# Patient Record
Sex: Female | Born: 1985 | Race: White | Hispanic: No | Marital: Married | Smoking: Current every day smoker
Health system: Southern US, Community
[De-identification: ages and names within clinical notes are randomized; demographics above are authoritative.]

---

## 2007-09-06 ENCOUNTER — Encounter: Payer: Self-pay | Admitting: Family Medicine

## 2007-10-04 ENCOUNTER — Encounter: Payer: Self-pay | Admitting: Family Medicine

## 2008-05-29 ENCOUNTER — Encounter: Payer: Self-pay | Admitting: Family Medicine

## 2009-02-24 ENCOUNTER — Ambulatory Visit: Payer: Self-pay | Admitting: Family Medicine

## 2009-03-24 ENCOUNTER — Other Ambulatory Visit: Admission: RE | Admit: 2009-03-24 | Discharge: 2009-03-24 | Payer: Self-pay | Admitting: Family Medicine

## 2009-03-24 ENCOUNTER — Ambulatory Visit: Payer: Self-pay | Admitting: Family Medicine

## 2009-03-26 ENCOUNTER — Encounter: Payer: Self-pay | Admitting: Family Medicine

## 2009-03-26 DIAGNOSIS — F17200 Nicotine dependence, unspecified, uncomplicated: Secondary | ICD-10-CM

## 2009-03-26 DIAGNOSIS — F341 Dysthymic disorder: Secondary | ICD-10-CM | POA: Insufficient documentation

## 2009-03-26 LAB — CONVERTED CEMR LAB
Albumin: 4.2 g/dL (ref 3.5–5.2)
Alkaline Phosphatase: 57 units/L (ref 39–117)
BUN: 16 mg/dL (ref 6–23)
Creatinine, Ser: 0.67 mg/dL (ref 0.40–1.20)
Glucose, Bld: 81 mg/dL (ref 70–99)
HDL: 63 mg/dL (ref >39)
LDL Cholesterol: 82 mg/dL (ref 0–99)
Potassium: 4.1 meq/L (ref 3.5–5.3)
Total Bilirubin: 0.6 mg/dL (ref 0.3–1.2)
Triglycerides: 59 mg/dL (ref <150)

## 2009-04-16 ENCOUNTER — Encounter: Payer: Self-pay | Admitting: Family Medicine

## 2009-06-25 ENCOUNTER — Ambulatory Visit: Payer: Self-pay | Admitting: Family Medicine

## 2009-08-18 ENCOUNTER — Ambulatory Visit: Payer: Self-pay | Admitting: Family Medicine

## 2009-08-18 DIAGNOSIS — R1013 Epigastric pain: Secondary | ICD-10-CM

## 2009-08-18 DIAGNOSIS — K3189 Other diseases of stomach and duodenum: Secondary | ICD-10-CM | POA: Insufficient documentation

## 2009-08-18 DIAGNOSIS — N760 Acute vaginitis: Secondary | ICD-10-CM | POA: Insufficient documentation

## 2009-08-19 ENCOUNTER — Encounter: Payer: Self-pay | Admitting: Family Medicine

## 2009-08-19 LAB — CONVERTED CEMR LAB: Trich, Wet Prep: NONE SEEN

## 2009-12-02 ENCOUNTER — Ambulatory Visit: Payer: Self-pay | Admitting: Family Medicine

## 2009-12-02 DIAGNOSIS — J209 Acute bronchitis, unspecified: Secondary | ICD-10-CM | POA: Insufficient documentation

## 2010-01-27 ENCOUNTER — Ambulatory Visit: Payer: Self-pay | Admitting: Family Medicine

## 2010-01-27 DIAGNOSIS — F410 Panic disorder [episodic paroxysmal anxiety] without agoraphobia: Secondary | ICD-10-CM

## 2010-03-18 ENCOUNTER — Ambulatory Visit: Payer: Self-pay | Admitting: Family Medicine

## 2010-04-08 ENCOUNTER — Encounter: Payer: Self-pay | Admitting: Family Medicine

## 2010-04-08 ENCOUNTER — Ambulatory Visit
Admission: RE | Admit: 2010-04-08 | Discharge: 2010-04-08 | Payer: Self-pay | Source: Home / Self Care | Attending: Family Medicine | Admitting: Family Medicine

## 2010-04-08 LAB — CONVERTED CEMR LAB

## 2010-04-13 ENCOUNTER — Encounter: Payer: Self-pay | Admitting: Family Medicine

## 2010-05-10 NOTE — Letter (Signed)
Summary: Anxiety Screening/[facility]  Anxiety Screening/[facility]   Imported By: Sherian Rein 02/08/2010 11:03:13  _____________________________________________________________________  External Attachment:    Type:   Image     Comment:   External Document

## 2010-05-10 NOTE — Assessment & Plan Note (Signed)
Summary: f/u anxiety   Vital Signs:  Patient profile:   25 year old female Menstrual status:  regular Height:      64 inches Weight:      142 pounds BMI:     24.46 O2 Sat:      99 % on Room air Pulse rate:   75 / minute BP sitting:   102 / 67  (left arm) Cuff size:   regular  Vitals Entered By: Payton Spark CMA (June 25, 2009 10:26 AM)  O2 Flow:  Room air CC: Stopped Paxil x 3 weeks ago due to GI upset. Would like to try something different.    Primary Care Provider:  Seymour Bars DO  CC:  Stopped Paxil x 3 weeks ago due to GI upset. Would like to try something different. .  History of Present Illness: 25 yo WF presents for f/u mood.  She weaned herself off Paxil 3 wks ago.  She was taking it for irritable mood and it was working well.  She developed problems with nausea to the point of vomitting as a medication SE.    Off the medication, her nausea has completely resolved.  She feels irritable everyday and doesn't relate it to any particular stressors.  She has not been tearful.  Her mom is bipolar.  She sleeps well at night.  She has panic attacks from time to time.  She drinks a lot of caffeine and smokes.  She enjoys playing w/ her dogs.  She does not regularly exercise.  Denies any life stressors.  Her boyfriend is supportive and she is happy.  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      Allergies: No Known Drug Allergies  Past History:  Past Medical History: Reviewed history from 03/26/2009 and no changes required. depresion/ anxiety G0 smoker  Past Surgical History: Reviewed history from 02/24/2009 and no changes required. none  Family History: Reviewed history from 02/24/2009 and no changes required. brother and sister healthy father died of AMI at 36, ETOHism mother living with depression, uterine cancer, bipolar d/o  Social History: Reviewed history from 03/26/2009 and no changes required. 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  Lives with sister and grandfather. Smokes 1 ppd x 6 yrs. No exercise. Has 2 dos.  Review of Systems Psych:  Complains of anxiety, easily angered, irritability, and panic attacks; denies alternate hallucination ( auditory/visual), depression, easily tearful, sense of great danger, suicidal thoughts/plans, thoughts of violence, unusual visions or sounds, and thoughts /plans of harming others.  Physical Exam  General:  alert, well-developed, well-nourished, and well-hydrated.   Head:  normocephalic and atraumatic.   Skin:  color normal.   Psych:  good eye contact, not anxious appearing, and not depressed appearing.     Impression & Recommendations:  Problem # 1:  ANXIETY DEPRESSION (ICD-300.4) Mild anxiety.  Pt wishes to try another agent. She has tried and failed Citalopram, paxil and hydroxyzine. Call if any problems on Lexapro 10 mg/ day.  Keep working on relaxation, cutting back on caffeine and tobacco along w/ regular exercise.  Complete Medication List: 1)  Tri-sprintec 0.18/0.215/0.25 Mg-35 Mcg Tabs (Norgestim-eth estrad triphasic) .... Take 1 tab by mouth once daily 2)  Lexapro 10 Mg Tabs (Escitalopram oxalate) .Marland Kitchen.. 1 tab by mouth qam with breakfast  Patient Instructions: 1)  STart on Lexapro 10 mg every morning w/ breakfast. 2)  Call if any problems. 3)  Work on  smoking cessation and cutting back on caffeine. 4)  Regular exericse will help. 5)  Return for f/u mood in 3 mos. Prescriptions: LEXAPRO 10 MG TABS (ESCITALOPRAM OXALATE) 1 tab by mouth qAM with breakfast  #30 x 1   Entered and Authorized by:   Seymour Bars DO   Signed by:   Seymour Bars DO on 06/25/2009   Method used:   Electronically to        [facility] [phone]* (retail)       [address]       [place], [place]  [address]       Ph: [phone]       Fax: [phone]   RxID:   [id number]

## 2010-05-10 NOTE — Assessment & Plan Note (Signed)
Summary: black stools/ vaginitis   Vital Signs:  Patient profile:   25 year old female Menstrual status:  regular Height:      64 inches Weight:      147 pounds BMI:     25.32 O2 Sat:      98 % on Room air Pulse rate:   69 / minute BP sitting:   95 / 65  (left arm) Cuff size:   regular  Vitals Entered By: Payton Spark CMA (Aug 18, 2009 10:13 AM)  O2 Flow:  Room air CC: ? yeast infection x 1 week. Also c/o tight/pressure feeling in upper abd, some black stools x 3 days.   Primary Care Provider:  Seymour Bars DO  CC:  ? yeast infection x 1 week. Also c/o tight/pressure feeling in upper abd and some black stools x 3 days.Marland Kitchen  History of Present Illness: 25 yo WF presents for symptoms of yeast infection x 1 wk.  She noticed a change in odor with itching and a white discharge.  She has not been on abx recently.  [facility] treated her for one 2 mos ago.  She is due for her period now.  She has not used anything OTC.  Denies any external rash.  Denies external itching.  She has also had indigestion.  She had nausea Monday with indigestion.  She took a Upreg and it was neg.  She had 2 BMs in the last 3 days and it has been black.  She feels bloated.  She had been a little constipated.  She had not been taking NSAIDs.  She usually only takes TUMS as needed.  She has been under more stress.   She did take 1 dose of Pepto on Monday.    Current Medications (verified): 1)  Tri-Sprintec 0.18/0.215/0.25 Mg-35 Mcg Tabs (Norgestim-Eth Estrad Triphasic) .... Take 1 Tab By Mouth Once Daily 2)  Lexapro 10 Mg Tabs (Escitalopram Oxalate) .Marland Kitchen.. 1 Tab By Mouth Qam With Breakfast  Allergies (verified): No Known Drug Allergies  Past History:  Past Medical History: Reviewed history from 03/26/2009 and no changes required. depresion/ anxiety G0 smoker  Social History: Reviewed history from 03/26/2009 and no changes required. 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  Lives with sister and grandfather. Smokes 1 ppd x 6 yrs. No exercise. Has 2 dos.  Review of Systems      See HPI  Physical Exam  General:  alert, well-developed, well-nourished, and well-hydrated.   Head:  normocephalic and atraumatic.   Eyes:  sclera non icteric Nose:  no nasal discharge.   Mouth:  pharynx pink and moist.   Neck:  no masses.   Lungs:  Normal respiratory effort, chest expands symmetrically. Lungs are clear to auscultation, no crackles or wheezes. Heart:  Normal rate and regular rhythm. S1 and S2 normal without gallop, murmur, click, rub or other extra sounds. Abdomen:  mildly distended.  epigastric TTP with guarding.  NABS.  No HSM. Pulses:  2+ radial pulses Extremities:  no LE edema Skin:  color normal and no rashes.   Cervical Nodes:  No lymphadenopathy noted   Impression & Recommendations:  Problem # 1:  DYSPEPSIA (ICD-536.8)  With black stools x 3 days, likely to have been from her pepto use.  HGB normal at 13 indicating no anemia. Will try her on Omeprazole 40 mg two times a day and see her back in 2 wks. Avoid ETOH, NSAIDs and highly acidic foods.  Orders: Fingerstick (36416) Hgb (01027)  Problem # 2:  VAGINITIS (ICD-616.10)  F/U [facility] tomorrow.  Orders: T-Wet Prep for Christoper Allegra, [facility] [phone])  Complete Medication List: 1)  Tri-sprintec 0.18/0.215/0.25 Mg-35 Mcg Tabs (Norgestim-eth estrad triphasic) .... Take 1 tab by mouth once daily 2)  Lexapro 10 Mg Tabs (Escitalopram oxalate) .Marland Kitchen.. 1 tab by mouth qam with breakfast 3)  Omeprazole 40 Mg Cpdr (Omeprazole) .Marland Kitchen.. 1 tab by mouth two times a day x 2 wks  Patient Instructions: 1)  Will call you with wet prep results tomorrow. 2)  Start on Omeprazole 40 mg 2 x a day, take 30 min before breakfast and at bedtime. 3)  Avoid spicy food, OJ, tomato sauce, soda.  Stick to a bland diet. 4)  Avoid OTC anti - inflammatories. 5)  Return for f/u dyspepsia in 2 wks. 6)   Hemoglobin today: Prescriptions: OMEPRAZOLE 40 MG CPDR (OMEPRAZOLE) 1 tab by mouth two times a day x 2 wks  #28 x 0   Entered and Authorized by:   Seymour Bars DO   Signed by:   Seymour Bars DO on 08/18/2009   Method used:   Electronically to        [facility] [phone]* (retail)       [address]       [place], [place]  [address]       Ph: [phone]       Fax: [phone]   RxID:   [id number]   Laboratory Results   CBC   HGB:  13.8 g/dL   (Normal Range: 25.4-27.0 in Males, 12.0-15.0 in Females)

## 2010-05-10 NOTE — Assessment & Plan Note (Signed)
Summary: viral bronchitis   Vital Signs:  Patient profile:   25 year old female Menstrual status:  regular Height:      64 inches Weight:      139 pounds BMI:     23.95 O2 Sat:      96 % on Room air Temp:     98.2 degrees F oral Pulse rate:   74 / minute BP sitting:   110 / 76  (left arm) Cuff size:   regular  Vitals Entered By: Payton Spark CMA (December 02, 2009 9:50 AM)  O2 Flow:  Room air CC: Chest congestion and cough x 2 days.   Primary Care Provider:  Seymour Bars DO  CC:  Chest congestion and cough x 2 days.Marland Kitchen  History of Present Illness: On Tuesday the patient noticed a cough that lastest throughout the day.  She reports that she produced yellow sputum with a sore throat this morning along with nausea at 4 am.  The patient has noticed that she started to wheeze last night and has been short of breath.  She denies any recent fevers or chills or muscle aches.  She also denies vomitting, headaches, stomach pain and sinus pain and pressure.   The patient has not taken anything to make it better and has noticed that it is getting worse.  She smokes about 1 ppd.     Current Medications (verified): 1)  Tri-Sprintec 0.18/0.215/0.25 Mg-35 Mcg Tabs (Norgestim-Eth Estrad Triphasic) .... Take 1 Tab By Mouth Once Daily 2)  Lexapro 10 Mg Tabs (Escitalopram Oxalate) .Marland Kitchen.. 1 Tab By Mouth Qam With Breakfast 3)  Omeprazole 40 Mg Cpdr (Omeprazole) .Marland Kitchen.. 1 Tab By Mouth Two Times A Day X 2 Wks  Allergies (verified): No Known Drug Allergies  Past History:  Past Medical History: Reviewed history from 03/26/2009 and no changes required. depresion/ anxiety G0 smoker  Past Surgical History: Reviewed history from 02/24/2009 and no changes required. none  Social History: Reviewed history from 03/26/2009 and no changes required. 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  Lives with sister and grandfather. Smokes 1 ppd x 6 yrs. No exercise. Has  2 dos.  Review of Systems       The patient complains of prolonged cough.  The patient denies fever, decreased hearing, chest pain, hemoptysis, and abdominal pain.    Physical Exam  General:  alert, well-developed, and well-nourished.   Head:  normocephalic and atraumatic.  sinuses NTTP Eyes:  conjunctiva clear Ears:  EACs patent; TMs translucent and gray with good cone of light and bony landmarks.  Nose:  no nasal discharge.   Mouth:  o/p pink and moist, no injection Neck:  supple, no thyromegaly, and no cervical lymphadenopathy.   Lungs:  Wheezing in the upper lung lung fields.  No E to A changes noted and no whispered pectoriloquy Heart:  normal rate, regular rhythm, no murmur, no gallop, and no rub.   Abdomen:  soft, non-tender, and normal bowel sounds.   Skin:  color normal and no rashes.   Cervical Nodes:  No lymphadenopathy noted Psych:  good eye contact and not anxious appearing.     Impression & Recommendations:  Problem # 1:  BRONCHITIS, VIRAL (ICD-466.0)  Avoid smoking.  Use Robitussin DM for symptom relief.  Out of work note given for today. Declined need for nighttime anti tussive. Start Albuterol HFA 2 puffs 3-4 x a day x 7 wk for wheezing/ dry cough.  Call  if not resolved in 7-10 days.  Her updated medication list for this problem includes:    Proair Hfa 108 (90 Base) Mcg/act Aers (Albuterol sulfate) .Marland Kitchen... 2 puffs 3-4 x a day x 7 days  Complete Medication List: 1)  Tri-sprintec 0.18/0.215/0.25 Mg-35 Mcg Tabs (Norgestim-eth estrad triphasic) .... Take 1 tab by mouth once daily 2)  Lexapro 10 Mg Tabs (Escitalopram oxalate) .Marland Kitchen.. 1 tab by mouth qam with breakfast 3)  Omeprazole 40 Mg Cpdr (Omeprazole) .Marland Kitchen.. 1 tab by mouth two times a day x 2 wks 4)  Proair Hfa 108 (90 Base) Mcg/act Aers (Albuterol sulfate) .... 2 puffs 3-4 x a day x 7 days  Patient Instructions: 1)  For viral bronchitis: 2)  Avoid smoking. 3)  Use Robitussin DM for chest congestion and  cough. 4)  Use Tyelnol or Ibuprofen for aches/ pains. 5)  Out of work note given for today.   6)  Use ProAir inhaler 2 puffs 3-4 x a day for the next wk for dry cough and wheezing.  7)  Call if not improved in 7-10 days. Prescriptions: PROAIR HFA 108 (90 BASE) MCG/ACT AERS (ALBUTEROL SULFATE) 2 puffs 3-4 x a day x 7 days  #1 x 0   Entered and Authorized by:   Seymour Bars DO   Signed by:   Seymour Bars DO on 12/02/2009   Method used:   Electronically to        [facility] [phone]* (retail)       [address]       [place], [place]  [address]       Ph: [phone]       Fax: [phone]   RxID:   [id number]

## 2010-05-10 NOTE — Assessment & Plan Note (Signed)
Summary: panic attacks   Vital Signs:  Patient profile:   25 year old female Menstrual status:  regular Height:      64 inches Weight:      139 pounds BMI:     23.95 O2 Sat:      96 % on Room air Pulse rate:   83 / minute BP sitting:   106 / 60  (left arm) Cuff size:   regular  Vitals Entered By: Payton Spark CMA (January 27, 2010 10:50 AM)  O2 Flow:  Room air CC: Increased stress and anxiety attacks.    Primary Care Provider:  Seymour Bars DO  CC:  Increased stress and anxiety attacks.Marland Kitchen  History of Present Illness: 25 yo WF presents for panic attacks.  She had one last night while pet sitting.  She felt hot and shakey.  After she laid down on the bed, it improved.  She is getting them 1 x a week.  She was talking about death and was thinking about her dad's death when this happened.  She is stressed out by her job right now.  She has done welll overall with Lexapro.  She has a good support system.      Allergies: No Known Drug Allergies  Past History:  Past Medical History: Reviewed history from 03/26/2009 and no changes required. depresion/ anxiety G0 smoker  Family History: Reviewed history from 02/24/2009 and no changes required. brother and sister healthy father died of AMI at 53, ETOHism mother living with depression, uterine cancer, bipolar d/o  Social History: Reviewed history from 03/26/2009 and no changes required. 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  Lives with sister and grandfather. Smokes 1 ppd x 6 yrs. No exercise. Has 2 dos.                    Review of Systems Psych:  Complains of anxiety, easily tearful, and panic attacks; denies depression, easily angered, irritability, suicidal thoughts/plans, thoughts of violence, and unusual visions or sounds.  Physical Exam  General:  alert, well-developed, well-nourished, and well-hydrated.   Psych:  good eye contact, not depressed appearing,  tearful, and slightly anxious.     Impression & Recommendations:  Problem # 1:  PANIC ATTACK (ICD-300.01) Assessment Deteriorated Increased frequency of panic attacks recently with underlying depression and anxiety that has been fairly well controlled on Lexapro.  Will increase her Lexapro to 20 mg/ day and add low dose use of Xanax sparingly.  Add behavioral therapy for panic d/o.  Call if any problems.  Discussed stress reduction, regular exercise, yoga and deep breathing.    Her updated medication list for this problem includes:    Lexapro 20 Mg Tabs (Escitalopram oxalate) .Marland Kitchen... 1 tab by mouth daily    Alprazolam 0.25 Mg Tabs (Alprazolam) .Marland Kitchen... 1 tab by mouth daily as needed for panic attacks  Orders: Psychology Referral (Psychology)  Complete Medication List: 1)  Tri-sprintec 0.18/0.215/0.25 Mg-35 Mcg Tabs (Norgestim-eth estrad triphasic) .... Take 1 tab by mouth once daily 2)  Lexapro 20 Mg Tabs (Escitalopram oxalate) .Marland Kitchen.. 1 tab by mouth daily 3)  Omeprazole 40 Mg Cpdr (Omeprazole) .Marland Kitchen.. 1 tab by mouth two times a day x 2 wks 4)  Proair Hfa 108 (90 Base) Mcg/act Aers (Albuterol sulfate) .... 2 puffs 3-4 x a day x 7 days 5)  Alprazolam 0.25 Mg Tabs (Alprazolam) .Marland Kitchen.. 1 tab by mouth daily as needed for panic attacks  Patient Instructions:  1)  Increase Lexapro to 20 mg once daily. 2)  Use Alprazolam as needed for panic attacks. 3)  REferral made for Behavioral psychology. 4)  Try to get regular exercise and work on stress reduction, deep breathing and yoga helps too! 5)  Return for follow up in 2 mos. Prescriptions: ALPRAZOLAM 0.25 MG TABS (ALPRAZOLAM) 1 tab by mouth daily as needed for panic attacks  #14 x 0   Entered and Authorized by:   Seymour Bars DO   Signed by:   Seymour Bars DO on 01/27/2010   Method used:   Printed then faxed to ...       [facility] [phone]* (retail)       [address] [place], [place]  [address]       Ph: [phone]       Fax:  [phone]   RxID:   [id number] LEXAPRO 20 MG TABS (ESCITALOPRAM OXALATE) 1 tab by mouth daily  #30 x 3   Entered and Authorized by:   Seymour Bars DO   Signed by:   Seymour Bars DO on 01/27/2010   Method used:   Electronically to        [facility] [phone]* (retail)       [address]       [place], [place]  [address]       Ph: [phone]       Fax: [phone]   RxID:   [id number]    Orders Added: 1)  Psychology Referral [Psychology] 2)  Est. Patient Level III [30160]

## 2010-05-10 NOTE — Miscellaneous (Signed)
Summary: pap smear  Clinical Lists Changes  Observations: Added new observation of PAP DUE: 03/24/2010 (04/16/2009 15:03) Added new observation of FLUVAXDUE: 03/24/2010 (04/16/2009 15:03) Added new observation of HDLNXTDUE: 03/24/2014 (04/16/2009 15:03) Added new observation of LDLNXTDUE: 03/24/2014 (04/16/2009 15:03) Added new observation of CREATNXTDUE: 03/24/2010 (04/16/2009 15:03) Added new observation of POTASSIUMDUE: 03/24/2010 (04/16/2009 15:03) Added new observation of LAST PAP DAT: 03/24/2009 (03/24/2009 15:03) Added new observation of PAP SMEAR: normal (03/24/2009 15:03)     PAP Result Date:  03/24/2009 PAP Result:  normal PAP Next Due:  1 yr Pls let pt know that her pap smear came back normal.  Seymour Bars, D.O.  Appended Document: pap smear Pt aware

## 2010-05-10 NOTE — Letter (Signed)
Summary: Out of Work  [facility]  [address]   [place], [place] [address]   Phone: [phone]  Fax: [phone]    January 27, 2010   Employee:  Marshell Garfinkel    To Whom It May Concern:   For Medical reasons, please excuse the above named employee from work for the following dates:  Start:   Oct 20th  End:   Oct 21st  If you need additional information, please feel free to contact our office.         Sincerely,    Seymour Bars DO

## 2010-05-10 NOTE — Letter (Signed)
Summary: Out of Work  [facility]  [address]   [place], [place] [address]   Phone: [phone]  Fax: [phone]    December 02, 2009   Employee:  Marshell Garfinkel    To Whom It May Concern:   For Medical reasons, please excuse the above named employee from work for the following dates:  Start:   Aug 25th  End:   Aug 26th  If you need additional information, please feel free to contact our office.         Sincerely,    Seymour Bars DO

## 2010-05-12 NOTE — Miscellaneous (Signed)
Summary: Pap smear normal  Clinical Lists Changes  Observations: Added new observation of PAP SMEAR:  Specimen Adequacy: Satisfactory for evaluation.   Interpretation/Result:Negative for intraepithelial Lesion or Malignancy.    Gc/ chlamydia neg. (04/08/2010 16:52)      Pap Smear  Procedure date:  04/08/2010  Findings:       Specimen Adequacy: Satisfactory for evaluation.   Interpretation/Result:Negative for intraepithelial Lesion or Malignancy.    Gc/ chlamydia neg.  Comments:      Repeat Pap in 1 year.     Pap Smear  Procedure date:  04/08/2010  Findings:       Specimen Adequacy: Satisfactory for evaluation.   Interpretation/Result:Negative for intraepithelial Lesion or Malignancy.    Gc/ chlamydia neg.  Comments:      Repeat Pap in 1 year.    Appended Document: Pap smear normal Pls let pt know that her pap smear came back normal.  Seymour Bars, D.O.  Appended Document: Pap smear normal LMOM informing Pt of the above

## 2010-05-12 NOTE — Assessment & Plan Note (Signed)
Summary: CPE with pap   Vital Signs:  Patient profile:   25 year old female Menstrual status:  regular LMP:     03/31/2010 Height:      64 inches Weight:      133 pounds BMI:     22.91 O2 Sat:      100 % on Room air Pulse rate:   87 / minute BP sitting:   113 / 73  (left arm) Cuff size:   regular  Vitals Entered By: Payton Spark CMA (April 08, 2010 1:05 PM)  O2 Flow:  Room air CC: CPE w/ pap LMP (date): 03/31/2010     Enter LMP: 03/31/2010 Last PAP Result normal   Primary Care Provider:  Seymour Bars DO  CC:  CPE w/ pap.  History of Present Illness: 25 yo WF presents for CPE with pap.  G0, married, monogamous.  Light, regular menses on Tri Sprintec.  Doing well.  Mood stable on Cymbalta, seeing psychiatrist.    Denies fam hx of breast or colon  cancer.  Father had AMI at 27.  She denies any CP or DOE and would like to quit smoking.     Current Medications (verified): 1)  Tri-Sprintec 0.18/0.215/0.25 Mg-35 Mcg Tabs (Norgestim-Eth Estrad Triphasic) .... Take 1 Tab By Mouth Once Daily 2)  Cymbalta 60 Mg Cpep (Duloxetine Hcl) .... Take 1 Cap By Mouth Once Daily 3)  Omeprazole 40 Mg Cpdr (Omeprazole) .Marland Kitchen.. 1 Tab By Mouth Two Times A Day X 2 Wks 4)  Proair Hfa 108 (90 Base) Mcg/act Aers (Albuterol Sulfate) .... 2 Puffs 3-4 X A Day X 7 Days 5)  Clonazepam 0.5 Mg Tabs (Clonazepam) .... Take 1 Tab By Mouth Two Times A Day As Needed  Allergies (verified): No Known Drug Allergies  Past History:  Past Medical History: Reviewed history from 03/26/2009 and no changes required. depresion/ anxiety G0 smoker  Past Surgical History: Reviewed history from 02/24/2009 and no changes required. none  Family History: Reviewed history from 02/24/2009 and no changes required. brother and sister healthy father died of AMI at 66, ETOHism mother living with depression, uterine cancer, bipolar d/o  Social History: Reviewed history from 03/26/2009 and no changes required. 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   Smokes 1 ppd x 6 yrs. No exercise. Has 2 dos.  Review of Systems  The patient denies anorexia, fever, weight loss, weight gain, vision loss, decreased hearing, hoarseness, chest pain, syncope, dyspnea on exertion, peripheral edema, prolonged cough, headaches, hemoptysis, abdominal pain, melena, hematochezia, severe indigestion/heartburn, hematuria, incontinence, genital sores, muscle weakness, suspicious skin lesions, transient blindness, difficulty walking, depression, unusual weight change, abnormal bleeding, enlarged lymph nodes, angioedema, breast masses, and testicular masses.    Physical Exam  General:  alert, well-developed, well-nourished, and well-hydrated.   Head:  normocephalic and atraumatic.   Eyes:  pupils equal, pupils round, and pupils reactive to light.   Ears:  no external deformities.   Nose:  no nasal discharge.   Mouth:  good dentition and pharynx pink and moist.   Neck:  no masses.   Breasts:  No mass, nodules, thickening, tenderness, bulging, retraction, inflamation, nipple discharge or skin changes noted.   Lungs:  Normal respiratory effort, chest expands symmetrically. Lungs are clear to auscultation, no crackles or wheezes. Heart:  Normal rate and regular rhythm. S1 and S2 normal without gallop, murmur, click, rub or other extra sounds. Abdomen:  soft, non-tender, normal bowel sounds, no distention,  no masses, and no guarding.   Pulses:  2+ radial and pedal pulses Extremities:  no LE edema Skin:  color normal.   Cervical Nodes:  No lymphadenopathy noted Psych:  good eye contact, not anxious appearing, and not depressed appearing.     Impression & Recommendations:  Problem # 1:  ROUTINE GYNECOLOGICAL EXAMINATION (ICD-V72.31) Thin prep pap smear done. Labs done, normal last year. Tri Sprintec RFd x 1 yr. Mood stable, seeing Psychiatrist and doing well on Cymbalta. Immunizations UTD. RTC  1 yr, sooner if needed.  Complete Medication List: 1)  Tri-sprintec 0.18/0.215/0.25 Mg-35 Mcg Tabs (Norgestim-eth estrad triphasic) .... Take 1 tab by mouth once daily 2)  Cymbalta 60 Mg Cpep (Duloxetine hcl) .... Take 1 cap by mouth once daily 3)  Omeprazole 40 Mg Cpdr (Omeprazole) .Marland Kitchen.. 1 tab by mouth two times a day x 2 wks 4)  Proair Hfa 108 (90 Base) Mcg/act Aers (Albuterol sulfate) .... 2 puffs 3-4 x a day x 7 days 5)  Clonazepam 0.5 Mg Tabs (Clonazepam) .... Take 1 tab by mouth two times a day as needed Prescriptions: TRI-SPRINTEC 0.18/0.215/0.25 MG-35 MCG TABS (NORGESTIM-ETH ESTRAD TRIPHASIC) Take 1 tab by mouth once daily  #1 month x 12   Entered and Authorized by:   Seymour Bars DO   Signed by:   Seymour Bars DO on 04/08/2010   Method used:   Electronically to        [facility] [phone]* (retail)       [address]       [place], [place]  [address]       Ph: [phone]       Fax: [phone]   RxID:   [id number]    Orders Added: 1)  Est. Patient age 102-39 [25]   Immunization History:  Tetanus/Td Immunization History:    Tetanus/Td:  historical (04/10/2006)   Immunization History:  Tetanus/Td Immunization History:    Tetanus/Td:  Historical (04/10/2006)

## 2010-08-17 ENCOUNTER — Encounter: Payer: Self-pay | Admitting: Emergency Medicine

## 2010-08-17 ENCOUNTER — Inpatient Hospital Stay (INDEPENDENT_AMBULATORY_CARE_PROVIDER_SITE_OTHER)
Admission: RE | Admit: 2010-08-17 | Discharge: 2010-08-17 | Disposition: A | Discharge status: Home / Self Care | Payer: PRIVATE HEALTH INSURANCE | Source: Ambulatory Visit | Attending: Emergency Medicine | Admitting: Emergency Medicine

## 2010-08-17 DIAGNOSIS — J069 Acute upper respiratory infection, unspecified: Secondary | ICD-10-CM

## 2010-08-17 DIAGNOSIS — J209 Acute bronchitis, unspecified: Secondary | ICD-10-CM

## 2010-10-10 ENCOUNTER — Encounter: Payer: Self-pay | Admitting: Family Medicine

## 2010-10-10 ENCOUNTER — Ambulatory Visit (INDEPENDENT_AMBULATORY_CARE_PROVIDER_SITE_OTHER): Payer: PRIVATE HEALTH INSURANCE | Admitting: Family Medicine

## 2010-10-10 VITALS — BP 103/62 | HR 80 | Temp 98.5°F | Ht 63.0 in | Wt 132.0 lb

## 2010-10-10 DIAGNOSIS — J029 Acute pharyngitis, unspecified: Secondary | ICD-10-CM

## 2010-10-10 NOTE — Assessment & Plan Note (Signed)
Rapid strep neg. Will treat with supportive care measures, outlined under pt instructions. Out of work note given for today and tomorrow.  Call if not improved in 10 days.

## 2010-10-10 NOTE — Patient Instructions (Signed)
For viral pharyngitis:   Take Ibuprofen 3 tabs 3 x a day with food for aches/ pain. Sip on clear fluids and rest. OK to use cepacol for sore throat.  Call if not resolved in 10 days.

## 2010-10-10 NOTE — Progress Notes (Signed)
  Subjective:    Patient ID: Debbie Rice, female    DOB: 1986/03/09, 25 y.o.   MRN: [id number]  HPI  25 yo WF presents for sore throat since yesterday with body aches.  She has a cough but no congestion.  No fevers or chills.  No GI upset or HAs.  No sick contacts.  She is not taking anything.    BP 103/62  Pulse 80  Temp(Src) 98.5 F (36.9 C) (Oral)  Ht 5\' 3"  (1.6 m)  Wt 132 lb (59.875 kg)  BMI 23.38 kg/m2  SpO2 97%  LMP 09/13/2010   Review of Systems  Constitutional: Positive for fatigue.  HENT: Positive for sore throat. Negative for congestion, rhinorrhea, mouth sores and sinus pressure.   Respiratory: Negative for cough and shortness of breath.   Cardiovascular: Negative for chest pain.  Gastrointestinal: Negative for nausea, vomiting, abdominal pain and diarrhea.  Musculoskeletal: Positive for myalgias and arthralgias.  Neurological: Negative for headaches.       Objective:   Physical Exam  Constitutional: She appears well-developed and well-nourished.  HENT:  Head: Normocephalic and atraumatic.  Right Ear: External ear normal.  Left Ear: External ear normal.  Nose: Nose normal. Right sinus exhibits no maxillary sinus tenderness and no frontal sinus tenderness. Left sinus exhibits no maxillary sinus tenderness and no frontal sinus tenderness.  Mouth/Throat: Posterior oropharyngeal erythema present. No oropharyngeal exudate or posterior oropharyngeal edema.  Cardiovascular: Normal rate, regular rhythm and normal heart sounds.   No murmur heard. Pulmonary/Chest: Effort normal and breath sounds normal.  Lymphadenopathy:    She has cervical adenopathy.  Skin: Skin is warm and dry.          Assessment & Plan:

## 2010-10-11 ENCOUNTER — Telehealth: Payer: Self-pay | Admitting: Family Medicine

## 2010-10-11 NOTE — Telephone Encounter (Signed)
Pt called because she was seen recently and at that visit she was asked by the provider if she has any rash and she had commented no at that time.  Today she has a rash.  Tried to contact the patient to get more info regarding the rash, but had to [facility] for the patient.  Please advise. Plan:  Routed to Dr. Arlice Colt, LPN [facility]

## 2010-10-11 NOTE — Telephone Encounter (Signed)
The rash is likely part of her viral syndrome as discussed yesterday.  It should resolve on it's own but she can do oatmeal baths if she is itchy.  Let me know if any other new symptoms.

## 2010-10-13 NOTE — Telephone Encounter (Signed)
Pt informed of the recommendations made by the provider.  Pt voiced understanding. Jarvis Newcomer, LPN [facility]

## 2010-10-14 ENCOUNTER — Ambulatory Visit (INDEPENDENT_AMBULATORY_CARE_PROVIDER_SITE_OTHER): Payer: PRIVATE HEALTH INSURANCE | Admitting: Family Medicine

## 2010-10-14 ENCOUNTER — Encounter: Payer: Self-pay | Admitting: Family Medicine

## 2010-10-14 VITALS — BP 111/63 | HR 75 | Temp 98.7°F | Wt 133.0 lb

## 2010-10-14 DIAGNOSIS — J209 Acute bronchitis, unspecified: Secondary | ICD-10-CM

## 2010-10-14 MED ORDER — ALBUTEROL 90 MCG/ACT IN AERS: 2.0000 | INHALATION_SPRAY | RESPIRATORY_TRACT | Status: DC | PRN

## 2010-10-14 MED ORDER — AZITHROMYCIN 250 MG PO TABS: ORAL_TABLET | ORAL | Status: AC

## 2010-10-14 NOTE — Patient Instructions (Signed)
For bronchitis:  Take Zithromax x 5 days, with food. Use Ventolin inhaler 2 puffs 4 x a day for the next week, then use as needed.  Avoid smoking.  Call if not resolved in 7 days.

## 2010-10-14 NOTE — Progress Notes (Signed)
  Subjective:    Patient ID: Debbie Rice, female    DOB: 1985/12/25, 25 y.o.   MRN: [id number]  HPI  25 yo WF presents for a deep cough following a recent viral infection.  She does smoke but she has cut back .  Her cough is productive of yellow phlegm.  She has chest tightness and a little SOB. She still has some congestion and postansal drip.  No longer having fevers.  Her fatigue and bodyaches have improved.  She has an old inhaler that she is using and it helps.  denie s nocturnal cough.  Not taking anything OTC.  BP 111/63  Pulse 75  Temp(Src) 98.7 F (37.1 C) (Oral)  Wt 133 lb (60.328 kg)  SpO2 96%  LMP 09/13/2010  Review of Systems  Constitutional: Negative for fever, chills and appetite change.  HENT: Positive for congestion and rhinorrhea. Negative for sore throat and sinus pressure.   Respiratory: Positive for cough, chest tightness, shortness of breath and wheezing.   Gastrointestinal: Negative for nausea.  Skin: Negative for rash.  Neurological: Negative for headaches.       Objective:   Physical Exam  Constitutional: She appears well-developed and well-nourished.  HENT:  Mouth/Throat: Oropharynx is clear and moist.       2+ tonsilar hypertrophy  Eyes: Conjunctivae are normal.  Cardiovascular: Normal rate, regular rhythm and normal heart sounds.   Pulmonary/Chest: Effort normal. No respiratory distress. She has wheezes (diffuse exp wheezing). She exhibits no tenderness.  Lymphadenopathy:    She has cervical adenopathy.  Skin: Skin is warm and dry.  Psychiatric: She has a normal mood and affect.          Assessment & Plan:  Bronchitis:  Will add Ventolin HFA (sample given) 2 puffs 4 x a day for the next wk then go to prn use.  OK to use an OTC anti tussive / mucolytic if needed.  Continue supportive care measures, rest, clear fluids.  Fill Zithromax by Monday if cough/ sputum is not improving or any worsening in SOB.  Avoid smoking.

## 2011-03-13 NOTE — Progress Notes (Signed)
Summary: SINUS PROBLEMS, COUGH,SORE THROAT/TJ   Vital Signs:  Patient Profile:   25 Years Old Female CC:      Nasal congestion with ear pain, headache and tightnes in chest x 4 days Height:     64 inches Weight:      135.50 pounds O2 Sat:      97 % O2 treatment:    Room Air Temp:     97.4 degrees F oral Pulse rate:   81 / minute Resp:     16 per minute BP sitting:   105 / 65  (left arm) Cuff size:   regular  Pt. in pain?   yes    Location:   head    Intensity:   3    Type:       heaviness  Vitals Entered By: Lavell Islam RN (Aug 17, 2010 6:52 PM)                   Current Allergies: No known allergies History of Present Illness History from: patient Chief Complaint: Nasal congestion with ear pain, headache and tightnes in chest x 4 days History of Present Illness: 25 Years Old Female complains of onset of cold symptoms for 4 days.  Debbie Rice has been using nothign OTC.  Her boss was seen here earlier today.  She thinks she is actually getting better now. No sore throat + cough No pleuritic pain + wheezing + nasal congestion + post-nasal drainage No sinus pain/pressure No chest congestion No itchy/red eyes No earache No hemoptysis No SOB No chills/sweats No fever No nausea No vomiting No abdominal pain No diarrhea No skin rashes + fatigue + myalgias No headache   REVIEW OF SYSTEMS Constitutional Symptoms      Denies fever, chills, night sweats, weight loss, weight gain, and fatigue.  Eyes       Denies change in vision, eye pain, eye discharge, glasses, contact lenses, and eye surgery. Ear/Nose/Throat/Mouth       Complains of ear pain, sinus problems, sore throat, and hoarseness.      Denies hearing loss/aids, change in hearing, ear discharge, dizziness, frequent runny nose, frequent nose bleeds, and tooth pain or bleeding.      Comments: bilateral ear pain Respiratory       Complains of dry cough and wheezing.      Denies productive cough, shortness of  breath, asthma, bronchitis, and emphysema/COPD.      Comments: chest tight Cardiovascular       Denies murmurs, chest pain, and tires easily with exhertion.    Gastrointestinal       Denies stomach pain, nausea/vomiting, diarrhea, constipation, blood in bowel movements, and indigestion. Genitourniary       Denies painful urination, kidney stones, and loss of urinary control. Neurological       Denies paralysis, seizures, and fainting/blackouts. Musculoskeletal       Denies muscle pain, joint pain, joint stiffness, decreased range of motion, redness, swelling, muscle weakness, and gout.  Skin       Denies bruising, unusual mles/lumps or sores, and hair/skin or nail changes.  Psych       Denies mood changes, temper/anger issues, anxiety/stress, speech problems, depression, and sleep problems.  Past History:  Past Medical History: depresion/ anxiety G0 smoker annual bronchitis  Past Surgical History: Reviewed history from 02/24/2009 and no changes required. none  Family History: Reviewed history from 02/24/2009 and no changes required. brother and sister healthy father died of AMI at  34, ETOHism mother living with depression, uterine cancer, bipolar d/o  Social History: Reviewed history from 04/08/2010 and no changes required. 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   Smokes 1 ppd x 6 yrs. No exercise. Has 2 dos. Physical Exam General appearance: well developed, well nourished, no acute distress Ears: normal, no lesions or deformities Nasal: clear discharge Oral/Pharynx: clear PND, no erythema Chest/Lungs: scattered rhonchi (mild), no wheezes Heart: regular rate and  rhythm, no murmur MSE: oriented to time, place, and person Assessment New Problems: BRONCHITIS, VIRAL (ICD-466.0) UPPER RESPIRATORY INFECTION, ACUTE (ICD-465.9)   Plan New Medications/Changes: PREDNISONE (PAK) 10 MG TABS (PREDNISONE) 6 day pack, use as directed  #1 x  0, 08/17/2010, Hoyt Koch MD VENTOLIN HFA 108 (90 BASE) MCG/ACT AERS (ALBUTEROL SULFATE) 1-2 puffs q6 hrs as needed for wheezing  #1 x 1, 08/17/2010, Hoyt Koch MD  New Orders: Pulse Oximetry (single measurment) [phone] Services provided After hours-Weekends-Holidays [99051] New Patient Level II [99202] Planning Comments:   1)  Likely viral, so will treat with prednisone + inhaler.  May hold these for another 1-2 days if getting better. 2)  Use nasal saline solution (over the counter) at least 3 times a day. 3)  Use over the counter decongestants like Zyrtec-D every 12 hours as needed to help with congestion. 4)  Can take tylenol every 6 hours or motrin every 8 hours for pain or fever. 5)  Follow up with your primary doctor  if no improvement in 5-7 days, sooner if increasing pain, fever, or new symptoms.    The patient and/or caregiver has been counseled thoroughly with regard to medications prescribed including dosage, schedule, interactions, rationale for use, and possible side effects and they verbalize understanding.  Diagnoses and expected course of recovery discussed and will return if not improved as expected or if the condition worsens. Patient and/or caregiver verbalized understanding.  Prescriptions: PREDNISONE (PAK) 10 MG TABS (PREDNISONE) 6 day pack, use as directed  #1 x 0   Entered and Authorized by:   Hoyt Koch MD   Signed by:   Hoyt Koch MD on 08/17/2010   Method used:   Print then Give to Patient   RxID:   [id number] VENTOLIN HFA 108 (90 BASE) MCG/ACT AERS (ALBUTEROL SULFATE) 1-2 puffs q6 hrs as needed for wheezing  #1 x 1   Entered and Authorized by:   Hoyt Koch MD   Signed by:   Hoyt Koch MD on 08/17/2010   Method used:   Print then Give to Patient   RxID:   [id number]   Orders Added: 1)  Pulse Oximetry (single measurment) [94760] 2)  Services provided After hours-Weekends-Holidays [99051] 3)  New Patient  Level II [72536]

## 2011-04-18 ENCOUNTER — Ambulatory Visit (INDEPENDENT_AMBULATORY_CARE_PROVIDER_SITE_OTHER): Payer: BC Managed Care – PPO | Admitting: Family Medicine

## 2011-04-18 ENCOUNTER — Other Ambulatory Visit (HOSPITAL_COMMUNITY)
Admission: RE | Admit: 2011-04-18 | Discharge: 2011-04-18 | Disposition: A | Discharge status: Home / Self Care | Payer: BC Managed Care – PPO | Source: Ambulatory Visit | Attending: Family Medicine | Admitting: Family Medicine

## 2011-04-18 ENCOUNTER — Encounter: Payer: Self-pay | Admitting: Family Medicine

## 2011-04-18 VITALS — BP 98/64 | HR 99 | Wt 143.0 lb

## 2011-04-18 DIAGNOSIS — Z01419 Encounter for gynecological examination (general) (routine) without abnormal findings: Secondary | ICD-10-CM | POA: Insufficient documentation

## 2011-04-18 MED ORDER — NORGESTIM-ETH ESTRAD TRIPHASIC 0.18/0.215/0.25 MG-35 MCG PO TABS: 1.0000 | ORAL_TABLET | Freq: Every day | ORAL | Status: DC

## 2011-04-18 NOTE — Patient Instructions (Signed)
Start a regular exercise program and make sure you are eating a healthy diet Try to eat 4 servings of dairy a day or take a calcium supplement (500mg twice a day). Your vaccines are up to date.   

## 2011-04-18 NOTE — Progress Notes (Signed)
  Subjective:     Debbie Rice is a 26 y.o. female and is here for a comprehensive physical exam. The patient reports no problems.  History   Social History  . Marital Status: Single    Spouse Name: [name]    Number of Children: 0  . Years of Education: N/A   Occupational History  . Vet tech    Social History Main Topics  . Smoking status: Current Everyday Smoker -- 1.0 packs/day    Types: Cigarettes  . Smokeless tobacco: Not on file  . Alcohol Use: 0.0 - 0.5 oz/week    0-1 drink(s) per week  . Drug Use: Not on file  . Sexually Active: Yes -- Female partner(s)    Birth Control/ Protection: OCP   Other Topics Concern  . Not on file   Social History Narrative   No regular exercise.    Health Maintenance  Topic Date Due  . Influenza Vaccine  01/09/2012  . Pap Smear  04/17/2014  . Tetanus/tdap  04/10/2016    The following portions of the patient's history were reviewed and updated as appropriate: allergies, current medications, past family history, past medical history, past social history, past surgical history and problem list.  Review of Systems A comprehensive review of systems was negative.   Objective:    BP 98/64  Pulse 99  Wt 143 lb (64.864 kg)  LMP 03/18/2011 General appearance: alert, cooperative and appears stated age Head: Normocephalic, without obvious abnormality, atraumatic Eyes: conj clear, EOMi, PEERLA Ears: normal TM's and external ear canals both ears Nose: Nares normal. Septum midline. Mucosa normal. No drainage or sinus tenderness. Throat: lips, mucosa, and tongue normal; teeth and gums normal Neck: no adenopathy, no carotid bruit, no JVD, supple, symmetrical, trachea midline and thyroid not enlarged, symmetric, no tenderness/mass/nodules Back: symmetric, no curvature. ROM normal. No CVA tenderness. Lungs: clear to auscultation bilaterally Breasts: normal appearance, no masses or tenderness Heart: regular rate and rhythm, S1, S2 normal, no murmur,  click, rub or gallop Abdomen: soft, non-tender; bowel sounds normal; no masses,  no organomegaly Pelvic: cervix normal in appearance, external genitalia normal, no adnexal masses or tenderness, no cervical motion tenderness, rectovaginal septum normal, uterus normal size, shape, and consistency and vagina normal without discharge Extremities: extremities normal, atraumatic, no cyanosis or edema Pulses: 2+ and symmetric Skin: Skin color, texture, turgor normal. No rashes or lesions Lymph nodes: Cervical, supraclavicular, and axillary nodes normal. Neurologic: Grossly normal    Assessment:    Healthy female exam.      Plan:  Start a regular exercise program and make sure you are eating a healthy diet Try to eat 4 servings of dairy a day or take a calcium supplement (500mg  twice a day). Your vaccines are up to date.  Given lab slip for screening labs.     See After Visit Summary for Counseling Recommendations

## 2011-08-18 ENCOUNTER — Ambulatory Visit: Payer: BC Managed Care – PPO

## 2012-01-07 ENCOUNTER — Encounter: Payer: Self-pay | Admitting: Emergency Medicine

## 2012-01-07 ENCOUNTER — Emergency Department
Admission: EM | Admit: 2012-01-07 | Discharge: 2012-01-07 | Disposition: A | Discharge status: Home / Self Care | Payer: PRIVATE HEALTH INSURANCE | Source: Home / Self Care | Attending: Family Medicine | Admitting: Family Medicine

## 2012-01-07 DIAGNOSIS — R590 Localized enlarged lymph nodes: Secondary | ICD-10-CM

## 2012-01-07 DIAGNOSIS — M542 Cervicalgia: Secondary | ICD-10-CM

## 2012-01-07 DIAGNOSIS — H9209 Otalgia, unspecified ear: Secondary | ICD-10-CM

## 2012-01-07 DIAGNOSIS — R599 Enlarged lymph nodes, unspecified: Secondary | ICD-10-CM

## 2012-01-07 MED ORDER — PREDNISONE 20 MG PO TABS: 20.0000 mg | ORAL_TABLET | Freq: Two times a day (BID) | ORAL | Status: DC

## 2012-01-07 MED ORDER — HYDROCODONE-ACETAMINOPHEN 5-500 MG PO TABS: 1.0000 | ORAL_TABLET | Freq: Every evening | ORAL | Status: DC | PRN

## 2012-01-07 NOTE — ED Notes (Signed)
Reports intermittent and progressive pain across back and left side of neck and ear. No OTCs within last 4 hours.

## 2012-01-07 NOTE — ED Provider Notes (Signed)
History     CSN: [id number]  Arrival date & time 01/07/12  1614   First MD Initiated Contact with Patient 01/07/12 1645      Chief Complaint  Patient presents with  . Neck Pain     HPI Comments: Patient complains of onset of pain in left ear four days prior that improved, then became worse yesterday.  She complains of soreness in her left neck, improved somewhat with Excedrin Migraine.  No URI symptoms.  No fevers, chills, and sweats   The history is provided by the patient.    History reviewed. No pertinent past medical history.  History reviewed. No pertinent past surgical history.  Family History  Problem Relation Age of Onset  . Heart disease Father 30  . Heart disease Paternal Grandfather   . Heart disease Paternal Uncle   . Hypertension Paternal Grandfather   . Bipolar disorder Mother   . Uterine cancer Mother   . Alcohol abuse Father     History  Substance Use Topics  . Smoking status: Current Every Day Smoker -- 1.0 packs/day    Types: Cigarettes  . Smokeless tobacco: Not on file  . Alcohol Use: 0.0 - 0.5 oz/week    0-1 drink(s) per week    OB History    Grav Para Term Preterm Abortions TAB SAB Ect Mult Living                  Review of Systems No sore throat + sore neck No cough No pleuritic pain No wheezing No nasal congestion No post-nasal drainage No sinus pain/pressure No itchy/red eyes + left earache No hemoptysis No SOB No fever/chills No nausea No vomiting No abdominal pain No diarrhea No urinary symptoms No skin rashes + fatigue No myalgias + headache   Allergies  Review of patient's allergies indicates no known allergies.  Home Medications   Current Outpatient Rx  Name Route Sig Dispense Refill  . ALBUTEROL 90 MCG/ACT IN AERS Inhalation Inhale 2 puffs into the lungs every 4 (four) hours as needed for wheezing. 17 g 12  . CLONAZEPAM 1 MG PO TABS Oral Take 1 mg by mouth 2 (two) times daily as needed.      Marland Kitchen  HYDROCODONE-ACETAMINOPHEN 5-500 MG PO TABS Oral Take 1 tablet by mouth at bedtime as needed for pain. 8 tablet 0  . NORGESTIM-ETH ESTRAD TRIPHASIC 0.18/0.215/0.25 MG-35 MCG PO TABS Oral Take 1 tablet by mouth daily. 3 Package 3  . PREDNISONE 20 MG PO TABS Oral Take 1 tablet (20 mg total) by mouth 2 (two) times daily. Take with food. 10 tablet 0    BP 109/72  Pulse 80  Temp 98.4 F (36.9 C) (Oral)  Resp 16  Ht 5\' 3"  (1.6 m)  Wt 142 lb (64.411 kg)  BMI 25.15 kg/m2  SpO2 100%  LMP 12/17/2011  Physical Exam Nursing notes and Vital Signs reviewed. Appearance:  Patient appears healthy, stated age, and in no acute distress Eyes:  Pupils are equal, round, and reactive to light and accomodation.  Extraocular movement is intact.  Conjunctivae are not inflamed  Ears:  Canals normal.  Tympanic membranes normal.  No TMJ tenderness Nose:  Mildly congested turbinates.  No sinus tenderness.  Pharynx:  Normal Neck:  Supple.  Tender shotty anterior/posterior nodes on left Lungs:  Clear to auscultation.  Breath sounds are equal.  Heart:  Regular rate and rhythm without murmurs, rubs, or gallops.  Abdomen:  Nontender without masses or hepatosplenomegaly.  Bowel sounds are present.  No CVA or flank tenderness.  Extremities:  No edema.  No calf tenderness Skin:  No rash present.   ED Course  Procedures  none  Labs Reviewed - Tympanogram normal left ear; high peak height right ear    1. Neck pain on left side   2. Cervical adenopathy; suspect viral syndrome      MDM  Throat culture pending. Prednisone burst.  Lortab at bedtime for pain Take Tylenol daytime for pain Followup with Family Doctor if not improved in one week.         Lattie Haw, MD 01/10/12 [phone]

## 2012-02-07 ENCOUNTER — Ambulatory Visit (INDEPENDENT_AMBULATORY_CARE_PROVIDER_SITE_OTHER): Payer: PRIVATE HEALTH INSURANCE | Admitting: Family Medicine

## 2012-02-07 DIAGNOSIS — Z23 Encounter for immunization: Secondary | ICD-10-CM

## 2012-02-07 NOTE — Progress Notes (Signed)
  Subjective:    Patient ID: Debbie Rice, female    DOB: 12/09/1985, 26 y.o.   MRN: [id number]  HPI  Here for Tdap vaccine  Review of Systems     Objective:   Physical Exam        Assessment & Plan:

## 2012-02-07 NOTE — Progress Notes (Signed)
Patient tolerated no complications 

## 2012-05-04 ENCOUNTER — Other Ambulatory Visit: Payer: Self-pay | Admitting: Family Medicine

## 2012-06-13 ENCOUNTER — Other Ambulatory Visit: Payer: Self-pay | Admitting: Family Medicine

## 2012-06-20 ENCOUNTER — Ambulatory Visit (INDEPENDENT_AMBULATORY_CARE_PROVIDER_SITE_OTHER): Payer: PRIVATE HEALTH INSURANCE | Admitting: Family Medicine

## 2012-06-20 ENCOUNTER — Encounter: Payer: Self-pay | Admitting: Family Medicine

## 2012-06-20 ENCOUNTER — Other Ambulatory Visit (HOSPITAL_COMMUNITY)
Admission: RE | Admit: 2012-06-20 | Discharge: 2012-06-20 | Disposition: A | Discharge status: Home / Self Care | Payer: PRIVATE HEALTH INSURANCE | Source: Ambulatory Visit | Attending: Family Medicine | Admitting: Family Medicine

## 2012-06-20 VITALS — BP 120/73 | HR 99 | Ht 63.0 in | Wt 149.0 lb

## 2012-06-20 DIAGNOSIS — Z01419 Encounter for gynecological examination (general) (routine) without abnormal findings: Secondary | ICD-10-CM | POA: Insufficient documentation

## 2012-06-20 DIAGNOSIS — Z1151 Encounter for screening for human papillomavirus (HPV): Secondary | ICD-10-CM

## 2012-06-20 DIAGNOSIS — F410 Panic disorder [episodic paroxysmal anxiety] without agoraphobia: Secondary | ICD-10-CM

## 2012-06-20 MED ORDER — CLONAZEPAM 1 MG PO TABS: 1.0000 mg | ORAL_TABLET | Freq: Two times a day (BID) | ORAL | Status: DC | PRN

## 2012-06-20 MED ORDER — NORGESTIM-ETH ESTRAD TRIPHASIC 0.18/0.215/0.25 MG-35 MCG PO TABS: ORAL_TABLET | ORAL | Status: DC

## 2012-06-20 NOTE — Patient Instructions (Addendum)
Keep up a regular exercise program and make sure you are eating a healthy diet Try to eat 4 servings of dairy a day, or if you are lactose intolerant take a calcium with vitamin D daily.  Your vaccines are up to date.   

## 2012-06-20 NOTE — Progress Notes (Signed)
Subjective:     Debbie Rice is a 27 y.o. female and is here for a comprehensive physical exam. The patient reports no problems. She refill continue to the original prescriber but the bottle that she had was given her about 2 years ago. She says the last time she ventricle is about 6 months ago but does like to have a prescription on hand if needed. She has not had any recent panic attacks.  History   Social History  . Marital Status: Single    Spouse Name: [name]    Number of Children: 0  . 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 Smoking status: Current Every Day Smoker -- 1.00 packs/day    Types: Cigarettes  . Smokeless tobacco: Not on file  . Alcohol Use: 0 - .5 oz/week    0-1 drink(s) per week  . Drug Use: Not on file  . Sexually Active: Yes -- Female partner(s)    Birth Control/ Protection: OCP   Other Topics Concern  . Not on file   Social History Narrative   No regular exercise.    Health Maintenance  Topic Date Due  . Influenza Vaccine  12/10/2011  . Pap Smear  04/17/2014  . Tetanus/tdap  02/06/2022    The following portions of the patient's history were reviewed and updated as appropriate: allergies, current medications, past family history, past medical history, past social history, past surgical history and problem list.  Review of Systems A comprehensive review of systems was negative.   Objective:    BP 120/73  Pulse 99  Ht 5\' 3"  (1.6 m)  Wt 149 lb (67.586 kg)  BMI 26.4 kg/m2  LMP 06/13/2012 General appearance: alert, cooperative and appears stated age Head: Normocephalic, without obvious abnormality, atraumatic Eyes: conj clear, EOMi, PEERLA Ears: normal TM's and external ear canals both ears Nose: Nares normal. Septum midline. Mucosa normal. No drainage or sinus tenderness. Throat: lips, mucosa, and tongue normal; teeth and gums normal Neck: no adenopathy, no carotid bruit, no JVD, supple,  symmetrical, trachea midline and thyroid not enlarged, symmetric, no tenderness/mass/nodules Back: symmetric, no curvature. ROM normal. No CVA tenderness. Lungs: clear to auscultation bilaterally Breasts: normal appearance, no masses or tenderness Heart: regular rate and rhythm, S1, S2 normal, no murmur, click, rub or gallop Abdomen: soft, non-tender; bowel sounds normal; no masses,  no organomegaly Pelvic: cervix normal in appearance, external genitalia normal, no adnexal masses or tenderness, no cervical motion tenderness, rectovaginal septum normal, uterus normal size, shape, and consistency and vagina normal without discharge Extremities: extremities normal, atraumatic, no cyanosis or edema Pulses: 2+ and symmetric Skin: Skin color, texture, turgor normal. No rashes or lesions Lymph nodes: Cervical, supraclavicular, and axillary nodes normal. Neurologic: Grossly normal    Assessment:    Healthy female exam.      Plan:     See After Visit Summary for Counseling Recommendations  Keep up a regular exercise program and make sure you are eating a healthy diet Try to eat 4 servings of dairy a day, or if you are lactose intolerant take a calcium with vitamin D daily.  Your vaccines are up to date.  Due for CMP and fasting lipid panel.  Anxiety-gad 7 score of 4 today which is well controlled. I did refill her Klonopin for 30 tabs which hopefully last her a year. Encouraged her again to use them sparingly as she has been doing. Did  warn about addiction potential and dependency.  Refill her birth control for one year. We'll call her with her Pap smear results are available.

## 2013-01-29 ENCOUNTER — Ambulatory Visit (INDEPENDENT_AMBULATORY_CARE_PROVIDER_SITE_OTHER): Payer: PRIVATE HEALTH INSURANCE

## 2013-01-29 ENCOUNTER — Ambulatory Visit (INDEPENDENT_AMBULATORY_CARE_PROVIDER_SITE_OTHER): Payer: PRIVATE HEALTH INSURANCE | Admitting: Physician Assistant

## 2013-01-29 ENCOUNTER — Encounter: Payer: Self-pay | Admitting: Physician Assistant

## 2013-01-29 VITALS — BP 103/54 | HR 75 | Wt 141.0 lb

## 2013-01-29 DIAGNOSIS — M79609 Pain in unspecified limb: Secondary | ICD-10-CM

## 2013-01-29 DIAGNOSIS — M545 Low back pain, unspecified: Secondary | ICD-10-CM

## 2013-01-29 MED ORDER — MELOXICAM 15 MG PO TABS: 15.0000 mg | ORAL_TABLET | Freq: Every day | ORAL | Status: DC

## 2013-01-29 MED ORDER — KETOROLAC TROMETHAMINE 60 MG/2ML IM SOLN
60.0000 mg | Freq: Once | INTRAMUSCULAR | Status: AC
  Administered 2013-01-29: 60 mg via INTRAMUSCULAR

## 2013-01-29 MED ORDER — CYCLOBENZAPRINE HCL 10 MG PO TABS: 10.0000 mg | ORAL_TABLET | Freq: Three times a day (TID) | ORAL | Status: DC | PRN

## 2013-01-29 NOTE — Progress Notes (Signed)
  Subjective:    Patient ID: Debbie Rice, female    DOB: [birth year], 27 y.o.   MRN: [id number]  HPI Patient presents to the clinic with acute low back pain for the last 5 days. She has had on and off again low back pain for 5-6 years that have resolved on there own. She has never had imaging. Current episode started after she bent over fast and then coughed. Pain was immediate and in the very low back. There was some radiation of pain into buttocks and down the left leg but not into knee. She has been taking muscle relaxers and vicodin which have helped. She feels like she is getting better every day. She wants to make sure nothing else is going on. She denies any bowel or bladder dysfunction or saddle anesthia.    Review of Systems     Objective:   Physical Exam  Constitutional: She appears well-developed and well-nourished.  Musculoskeletal:  No tenderness over spine. Tight paraspinus muscles bilateral in lumbar region. Tenderness to palpation over SI joint bilaterally. ROM at waist normal with more discomfort with bending forward and backward. Negative straight leg test. Negative stork. Patellar reflexes 2+ symmetric. Strength 5/5 bilateral legs.   Skin: Skin is warm and dry.  Psychiatric: She has a normal mood and affect. Her behavior is normal.          Assessment & Plan:  Low back pain- possible SI joint radiculitis/muscle spasms. Will send for imaging. Gave shot of toradol 60mg  in office. Flexeril and mobic sent to pharmacy. Since pt is improving do not think needs tramadol at this time. Discussed warm compresses and low back exercises. Gave HO. Call if not improving over next week. Likely strengthing back and leg will help decrease episodes of spasms. Follow up if not improving.

## 2013-01-29 NOTE — Patient Instructions (Signed)

## 2013-07-14 ENCOUNTER — Other Ambulatory Visit: Payer: Self-pay | Admitting: Family Medicine

## 2014-05-13 ENCOUNTER — Encounter: Payer: Self-pay | Admitting: Physician Assistant

## 2014-05-13 ENCOUNTER — Ambulatory Visit (INDEPENDENT_AMBULATORY_CARE_PROVIDER_SITE_OTHER): Payer: BLUE CROSS/BLUE SHIELD | Admitting: Physician Assistant

## 2014-05-13 ENCOUNTER — Other Ambulatory Visit (HOSPITAL_COMMUNITY)
Admission: RE | Admit: 2014-05-13 | Discharge: 2014-05-13 | Disposition: A | Discharge status: Home / Self Care | Payer: BLUE CROSS/BLUE SHIELD | Source: Ambulatory Visit | Attending: Physician Assistant | Admitting: Physician Assistant

## 2014-05-13 VITALS — BP 120/69 | HR 72 | Ht 63.0 in | Wt 134.0 lb

## 2014-05-13 DIAGNOSIS — Z13220 Encounter for screening for lipoid disorders: Secondary | ICD-10-CM

## 2014-05-13 DIAGNOSIS — Z113 Encounter for screening for infections with a predominantly sexual mode of transmission: Secondary | ICD-10-CM | POA: Diagnosis present

## 2014-05-13 DIAGNOSIS — Z0000 Encounter for general adult medical examination without abnormal findings: Secondary | ICD-10-CM

## 2014-05-13 DIAGNOSIS — D239 Other benign neoplasm of skin, unspecified: Secondary | ICD-10-CM

## 2014-05-13 DIAGNOSIS — Z01419 Encounter for gynecological examination (general) (routine) without abnormal findings: Secondary | ICD-10-CM | POA: Diagnosis not present

## 2014-05-13 DIAGNOSIS — Z131 Encounter for screening for diabetes mellitus: Secondary | ICD-10-CM

## 2014-05-13 DIAGNOSIS — R110 Nausea: Secondary | ICD-10-CM

## 2014-05-13 DIAGNOSIS — D229 Melanocytic nevi, unspecified: Secondary | ICD-10-CM

## 2014-05-13 DIAGNOSIS — Z720 Tobacco use: Secondary | ICD-10-CM

## 2014-05-13 MED ORDER — NORETHIN ACE-ETH ESTRAD-FE 1-20 MG-MCG PO TABS: 1.0000 | ORAL_TABLET | Freq: Every day | ORAL | Status: DC

## 2014-05-13 MED ORDER — CLONAZEPAM 1 MG PO TABS: 1.0000 mg | ORAL_TABLET | Freq: Two times a day (BID) | ORAL | Status: DC | PRN

## 2014-05-13 NOTE — Progress Notes (Signed)
Subjective:     Debbie Rice is a 29 y.o. female and is here for a comprehensive physical exam. The patient reports problems - Patient would like to discuss birth control change. She feels like her birth control makes her nauseated most days. She gets a break only the week that she bleeds. She has tried maneuvering in the past and did not like it. She is not interested in an IUD.Marland Kitchen  History   Social History  . Marital Status: Married    Spouse Name: Darnelle Maffucci    Number of Children: 0  . 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 Smoking status: Current Every Day Smoker -- 1.00 packs/day    Types: Cigarettes  . Smokeless tobacco: Not on file  . Alcohol Use: 0.0 - 0.5 oz/week    0-1 drink(s) per week  . Drug Use: Not on file  . Sexual Activity:    Partners: Male    Birth Control/ Protection: OCP   Other Topics Concern  . Not on file   Social History Narrative   No regular exercise.    Health Maintenance  Topic Date Due  . INFLUENZA VACCINE  11/08/2013  . PAP SMEAR  06/21/2015  . TETANUS/TDAP  02/06/2022    The following portions of the patient's history were reviewed and updated as appropriate: allergies, current medications, past family history, past medical history, past social history, past surgical history and problem list.  Review of Systems A comprehensive review of systems was negative.   Objective:    BP 120/69 mmHg  Pulse 72  Ht 5\' 3"  (1.6 m)  Wt 134 lb (60.782 kg)  BMI 23.74 kg/m2 General appearance: alert, cooperative and appears stated age Head: Normocephalic, without obvious abnormality, atraumatic Eyes: conjunctivae/corneas clear. PERRL, EOM's intact. Fundi benign. Ears: normal TM's and external ear canals both ears Nose: Nares normal. Septum midline. Mucosa normal. No drainage or sinus tenderness. Throat: lips, mucosa, and tongue normal; teeth and gums normal Neck: no adenopathy, no carotid bruit, no  JVD, supple, symmetrical, trachea midline and thyroid not enlarged, symmetric, no tenderness/mass/nodules Back: symmetric, no curvature. ROM normal. No CVA tenderness. Lungs: clear to auscultation bilaterally Breasts: normal appearance, no masses or tenderness Heart: regular rate and rhythm, S1, S2 normal, no murmur, click, rub or gallop Abdomen: soft, non-tender; bowel sounds normal; no masses,  no organomegaly Pelvic: cervix normal in appearance, external genitalia normal, no adnexal masses or tenderness, no cervical motion tenderness, uterus normal size, shape, and consistency and vagina normal without discharge Extremities: extremities normal, atraumatic, no cyanosis or edema Pulses: 2+ and symmetric Skin: Skin color, texture, turgor normal. No rashes or lesions or Atypical hyperpigmented nevus of the lower back. Lymph nodes: Cervical, supraclavicular, and axillary nodes normal. Neurologic: Grossly normal    Assessment:    Healthy female exam.      Plan:     CPE-Pap smear done today. Patient declined flu shot. All other vaccines up-to-date. Fasting labs were given to patient to have drawn. Encouraged healthy diet with vitamin D 800 units and calcium 1200 mg daily. Encouraged regular exercise at least 150 minutes per week.  Anxiety-refilled Klonopin for as needed use. Only gave her 30 tablets today. Usually 30 tablets last her 3-6 months. GAD-7 was 4 today.  Tobacco abuse-encouraged patient to quit smoking. She was not ready for this today.  Nausea-appears to be associated with birth control pills. I did decrease estrogen  in her birth control pill to see if would help some with her nausea. I did warn her that decrease in estrogen can sometimes cause breakthrough bleeding. If she has any of this for more than 2 packs she can call office. She was not interested in NuvaRing, IUD or depth O shot.  Atypical nevus-patient has very pale skin and she had one lesion that looks suspicious. I  offered to biopsy this myself. She opted for a referral to dermatology. See After Visit Summary for Counseling Recommendations

## 2014-05-13 NOTE — Patient Instructions (Signed)

## 2014-05-17 ENCOUNTER — Other Ambulatory Visit: Payer: Self-pay | Admitting: Physician Assistant

## 2014-05-20 LAB — CERVICOVAGINAL ANCILLARY ONLY
BACTERIAL VAGINITIS: NEGATIVE
CANDIDA VAGINITIS: NEGATIVE

## 2014-05-20 LAB — CYTOLOGY - PAP

## 2014-05-22 ENCOUNTER — Other Ambulatory Visit: Payer: Self-pay | Admitting: Physician Assistant

## 2014-05-22 LAB — CERVICOVAGINAL ANCILLARY ONLY: Herpes: NEGATIVE

## 2014-05-22 MED ORDER — METRONIDAZOLE 500 MG PO TABS: 500.0000 mg | ORAL_TABLET | Freq: Two times a day (BID) | ORAL | Status: DC

## 2014-11-24 ENCOUNTER — Encounter: Payer: Self-pay | Admitting: Family Medicine

## 2014-11-24 ENCOUNTER — Ambulatory Visit (INDEPENDENT_AMBULATORY_CARE_PROVIDER_SITE_OTHER): Payer: BLUE CROSS/BLUE SHIELD | Admitting: Family Medicine

## 2014-11-24 VITALS — BP 108/65 | HR 83 | Temp 97.9°F | Wt 137.0 lb

## 2014-11-24 DIAGNOSIS — W57XXXA Bitten or stung by nonvenomous insect and other nonvenomous arthropods, initial encounter: Secondary | ICD-10-CM

## 2014-11-24 DIAGNOSIS — T148 Other injury of unspecified body region: Secondary | ICD-10-CM | POA: Diagnosis not present

## 2014-11-24 MED ORDER — DOXYCYCLINE HYCLATE 100 MG PO TABS: 100.0000 mg | ORAL_TABLET | Freq: Two times a day (BID) | ORAL | Status: DC

## 2014-11-24 NOTE — Patient Instructions (Signed)
Thank you for coming in today. Take doxycycline twice daily for 10 days. Wear plenty of screen screen while taking this medicine.  Tick Bite Information Ticks are insects that attach themselves to the skin and draw blood for food. There are various types of ticks. Common types include wood ticks and deer ticks. Most ticks live in shrubs and grassy areas. Ticks can climb onto your body when you make contact with leaves or grass where the tick is waiting. The most common places on the body for ticks to attach themselves are the scalp, neck, armpits, waist, and groin. Most tick bites are harmless, but sometimes ticks carry germs that cause diseases. These germs can be spread to a person during the tick's feeding process. The chance of a disease spreading through a tick bite depends on:   The type of tick.  Time of year.   How long the tick is attached.   Geographic location.  HOW CAN YOU PREVENT TICK BITES? Take these steps to help prevent tick bites when you are outdoors:  Wear protective clothing. Long sleeves and long pants are best.   Wear white clothes so you can see ticks more easily.  Tuck your pant legs into your socks.   If walking on a trail, stay in the middle of the trail to avoid brushing against bushes.  Avoid walking through areas with long grass.  Put insect repellent on all exposed skin and along boot tops, pant legs, and sleeve cuffs.   Check clothing, hair, and skin repeatedly and before going inside.   Brush off any ticks that are not attached.  Take a shower or bath as soon as possible after being outdoors.  WHAT IS THE PROPER WAY TO REMOVE A TICK? Ticks should be removed as soon as possible to help prevent diseases caused by tick bites. 1. If latex gloves are available, put them on before trying to remove a tick.  2. Using fine-point tweezers, grasp the tick as close to the skin as possible. You may also use curved forceps or a tick removal tool.  Grasp the tick as close to its head as possible. Avoid grasping the tick on its body. 3. Pull gently with steady upward pressure until the tick lets go. Do not twist the tick or jerk it suddenly. This may break off the tick's head or mouth parts. 4. Do not squeeze or crush the tick's body. This could force disease-carrying fluids from the tick into your body.  5. After the tick is removed, wash the bite area and your hands with soap and water or other disinfectant such as alcohol. 6. Apply a small amount of antiseptic cream or ointment to the bite site.  7. Wash and disinfect any instruments that were used.  Do not try to remove a tick by applying a hot match, petroleum jelly, or fingernail polish to the tick. These methods do not work and may increase the chances of disease being spread from the tick bite.  WHEN SHOULD YOU SEEK MEDICAL CARE? Contact your health care provider if you are unable to remove a tick from your skin or if a part of the tick breaks off and is stuck in the skin.  After a tick bite, you need to be aware of signs and symptoms that could be related to diseases spread by ticks. Contact your health care provider if you develop any of the following in the days or weeks after the tick bite:  Unexplained fever.  Rash.  A circular rash that appears days or weeks after the tick bite may indicate the possibility of Lyme disease. The rash may resemble a target with a bull's-eye and may occur at a different part of your body than the tick bite.  Redness and swelling in the area of the tick bite.   Tender, swollen lymph glands.   Diarrhea.   Weight loss.   Cough.   Fatigue.   Muscle, joint, or bone pain.   Abdominal pain.   Headache.   Lethargy or a change in your level of consciousness.  Difficulty walking or moving your legs.   Numbness in the legs.   Paralysis.  Shortness of breath.   Confusion.   Repeated vomiting.  Document Released:  03/24/2000 Document Revised: 01/15/2013 Document Reviewed: 09/04/2012 [facility] Patient Information 2015 [place], [place]. This information is not intended to replace advice given to you by your health care provider. Make sure you discuss any questions you have with your health care provider.

## 2014-11-24 NOTE — Assessment & Plan Note (Signed)
With likely reactive lymphadenopathy. Empiric treatment with doxycycline. Warned patient about pregnancy and sunburn.

## 2014-11-24 NOTE — Progress Notes (Signed)
Debbie Rice is a 29 y.o. female who presents to [facility]  today for tick bite. Patient was bitten by a tick on her left posterior scalp on August 5. She notes swollen tender bumps along the posterior scalp in the posterior lateral neck. She denies any rash fevers chills nausea vomiting or diarrhea. She has not tried any medications yet. She feels well otherwise.   No past medical history on file. No past surgical history on file. Social History  Substance Use Topics  . Smoking status: Current Every Day Smoker -- 1.00 packs/day    Types: Cigarettes  . Smokeless tobacco: Not on file  . Alcohol Use: 0.0 - 0.5 oz/week    0-1 drink(s) per week   ROS as above Medications: Current Outpatient Prescriptions  Medication Sig Dispense Refill  . clonazePAM (KLONOPIN) 1 MG tablet Take 1 tablet (1 mg total) by mouth 2 (two) times daily as needed. 30 tablet 0  . norethindrone-ethinyl estradiol (LOESTRIN FE 1/20) 1-20 MG-MCG tablet Take 1 tablet by mouth daily. 1 Package 11  . doxycycline (VIBRA-TABS) 100 MG tablet Take 1 tablet (100 mg total) by mouth 2 (two) times daily. 20 tablet 0   No current facility-administered medications for this visit.   No Known Allergies   Exam:  BP 108/65 mmHg  Pulse 83  Temp(Src) 97.9 F (36.6 C) (Oral)  Wt 137 lb (62.143 kg) Gen: Well NAD HEENT: EOMI,  MMM Lungs: Normal work of breathing. CTABL Heart: RRR no MRG Abd: NABS, Soft. Nondistended, Nontender Exts: Brisk capillary refill, warm and well perfused.  Skin: No rash. Tender small lymph node at the posterior cervical chain. Less than 1 cm  No results found for this or any previous visit (from the past 24 hour(s)). No results found.   Please see individual assessment and plan sections.

## 2014-12-18 ENCOUNTER — Telehealth: Payer: Self-pay | Admitting: Physician Assistant

## 2014-12-18 NOTE — Telephone Encounter (Signed)
Rodman Key,   Pt is being charged for STD testing. I think it is a coding issue because I didn't put STD screening. Can we change this and see if covered. Trichomonas was found.

## 2015-03-29 ENCOUNTER — Other Ambulatory Visit: Payer: Self-pay | Admitting: Physician Assistant

## 2015-04-08 MED ORDER — NORETHIN ACE-ETH ESTRAD-FE 1-20 MG-MCG PO TABS: 1.0000 | ORAL_TABLET | Freq: Every day | ORAL | Status: DC

## 2015-04-08 NOTE — Addendum Note (Signed)
Addended by: Huel Cote on: 04/08/2015 04:23 PM   Modules accepted: Orders

## 2015-05-13 ENCOUNTER — Other Ambulatory Visit: Payer: Self-pay

## 2015-05-13 MED ORDER — NORETHIN ACE-ETH ESTRAD-FE 1-20 MG-MCG PO TABS: 1.0000 | ORAL_TABLET | Freq: Every day | ORAL | Status: DC

## 2015-06-30 ENCOUNTER — Ambulatory Visit (INDEPENDENT_AMBULATORY_CARE_PROVIDER_SITE_OTHER): Payer: BLUE CROSS/BLUE SHIELD | Admitting: Physician Assistant

## 2015-06-30 ENCOUNTER — Encounter: Payer: Self-pay | Admitting: Physician Assistant

## 2015-06-30 VITALS — BP 118/74 | HR 68 | Ht 63.0 in | Wt 135.0 lb

## 2015-06-30 DIAGNOSIS — Z13220 Encounter for screening for lipoid disorders: Secondary | ICD-10-CM | POA: Diagnosis not present

## 2015-06-30 DIAGNOSIS — Z131 Encounter for screening for diabetes mellitus: Secondary | ICD-10-CM | POA: Diagnosis not present

## 2015-06-30 DIAGNOSIS — F410 Panic disorder [episodic paroxysmal anxiety] without agoraphobia: Secondary | ICD-10-CM

## 2015-06-30 DIAGNOSIS — E049 Nontoxic goiter, unspecified: Secondary | ICD-10-CM

## 2015-06-30 DIAGNOSIS — R5383 Other fatigue: Secondary | ICD-10-CM

## 2015-06-30 DIAGNOSIS — Z0000 Encounter for general adult medical examination without abnormal findings: Secondary | ICD-10-CM | POA: Diagnosis not present

## 2015-06-30 MED ORDER — NORETHIN ACE-ETH ESTRAD-FE 1-20 MG-MCG PO TABS: 1.0000 | ORAL_TABLET | Freq: Every day | ORAL | Status: DC

## 2015-06-30 MED ORDER — CLONAZEPAM 1 MG PO TABS: 1.0000 mg | ORAL_TABLET | Freq: Two times a day (BID) | ORAL | Status: DC | PRN

## 2015-06-30 NOTE — Progress Notes (Signed)
  Subjective:     Debbie Rice is a 30 y.o. female and is here for a comprehensive physical exam. The patient reports no problems.  Social History   Social History  . Marital Status: Married    Spouse Name: Darnelle Maffucci  . Number of Children: 0  . 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 Smoking status: Current Every Day Smoker -- 1.00 packs/day    Types: Cigarettes  . Smokeless tobacco: Not on file  . Alcohol Use: 0.0 - 0.5 oz/week    0-1 drink(s) per week  . Drug Use: Not on file  . Sexual Activity:    Partners: Male    Birth Control/ Protection: OCP   Other Topics Concern  . Not on file   Social History Narrative   No regular exercise.    Health Maintenance  Topic Date Due  . HIV Screening  06/20/1998  . INFLUENZA VACCINE  12/31/2015 (Originally 11/09/2014)  . PAP SMEAR  05/13/2017  . TETANUS/TDAP  02/06/2022    The following portions of the patient's history were reviewed and updated as appropriate: allergies, current medications, past family history, past medical history, past social history, past surgical history and problem list.  Review of Systems A comprehensive review of systems was negative.   Objective:    BP 118/74 mmHg  Pulse 68  Ht 5\' 3"  (1.6 m)  Wt 135 lb (61.236 kg)  BMI 23.92 kg/m2  LMP 06/29/2015 General appearance: alert, cooperative and appears stated age Head: Normocephalic, without obvious abnormality, atraumatic Eyes: conjunctivae/corneas clear. PERRL, EOM's intact. Fundi benign. Ears: normal TM's and external ear canals both ears Nose: Nares normal. Septum midline. Mucosa normal. No drainage or sinus tenderness. Throat: lips, mucosa, and tongue normal; teeth and gums normal Neck: no adenopathy, no carotid bruit, no JVD, supple, symmetrical, trachea midline and bilateral thyroid enlargment with right being slightly larger than left.  Back: symmetric, no curvature. ROM normal. No CVA  tenderness. Lungs: clear to auscultation bilaterally Breasts: normal appearance, no masses or tenderness Heart: regular rate and rhythm, S1, S2 normal, no murmur, click, rub or gallop Abdomen: soft, non-tender; bowel sounds normal; no masses,  no organomegaly Pelvic: external genitalia normal, no adnexal masses or tenderness and no cervical motion tenderness Extremities: extremities normal, atraumatic, no cyanosis or edema Pulses: 2+ and symmetric Skin: Skin color, texture, turgor normal. No rashes or lesions Lymph nodes: Cervical, supraclavicular, and axillary nodes normal. Neurologic: Alert and oriented X 3, normal strength and tone. Normal symmetric reflexes. Normal coordination and gait    Assessment:    Healthy female exam.       Plan:    CPE- pap up to date. bimaneul normal today. Declined STD testing. Fasting labs ordered. Flu shot declined. Encouraged vitamin D 800 units and calcium 1500mg /4 servings of dairy daily. OCP refilled for 1 year.  Thyroid enlargement/no energy- she does report after discussing thyroid that she has been more fatigued lately. Will order fatigue panel. Especially focusing on thyroid.   Panic attacks- klonapin refilled. Last refill was 05/2014. Only taking as needed.  See After Visit Summary for Counseling Recommendations

## 2015-06-30 NOTE — Patient Instructions (Signed)

## 2015-07-20 ENCOUNTER — Ambulatory Visit (INDEPENDENT_AMBULATORY_CARE_PROVIDER_SITE_OTHER): Payer: BLUE CROSS/BLUE SHIELD

## 2015-07-20 DIAGNOSIS — R5383 Other fatigue: Secondary | ICD-10-CM | POA: Diagnosis not present

## 2015-07-20 DIAGNOSIS — E049 Nontoxic goiter, unspecified: Secondary | ICD-10-CM | POA: Diagnosis not present

## 2015-07-20 LAB — CBC
HCT: 43.2 % (ref 35.0–45.0)
HEMOGLOBIN: 14.9 g/dL (ref 11.7–15.5)
MCH: 32.5 pg (ref 27.0–33.0)
MCHC: 34.5 g/dL (ref 32.0–36.0)
MCV: 94.3 fL (ref 80.0–100.0)
MPV: 10.5 fL (ref 7.5–12.5)
Platelets: 217 10*3/uL (ref 140–400)
RBC: 4.58 MIL/uL (ref 3.80–5.10)
RDW: 13.3 % (ref 11.0–15.0)
WBC: 9 10*3/uL (ref 3.8–10.8)

## 2015-07-21 ENCOUNTER — Encounter: Payer: Self-pay | Admitting: Physician Assistant

## 2015-07-21 DIAGNOSIS — E559 Vitamin D deficiency, unspecified: Secondary | ICD-10-CM | POA: Insufficient documentation

## 2015-07-21 LAB — COMPLETE METABOLIC PANEL WITH GFR
ALT: 16 U/L (ref 6–29)
AST: 16 U/L (ref 10–30)
Albumin: 4.3 g/dL (ref 3.6–5.1)
Alkaline Phosphatase: 44 U/L (ref 33–115)
BUN: 13 mg/dL (ref 7–25)
CALCIUM: 9.5 mg/dL (ref 8.6–10.2)
CHLORIDE: 104 mmol/L (ref 98–110)
CO2: 24 mmol/L (ref 20–31)
CREATININE: 0.58 mg/dL (ref 0.50–1.10)
Glucose, Bld: 85 mg/dL (ref 65–99)
Potassium: 4.4 mmol/L (ref 3.5–5.3)
Sodium: 138 mmol/L (ref 135–146)
Total Bilirubin: 0.6 mg/dL (ref 0.2–1.2)
Total Protein: 6.5 g/dL (ref 6.1–8.1)

## 2015-07-21 LAB — T3, FREE: T3, Free: 3.4 pg/mL (ref 2.3–4.2)

## 2015-07-21 LAB — T4, FREE: FREE T4: 1.2 ng/dL (ref 0.8–1.8)

## 2015-07-21 LAB — FERRITIN: FERRITIN: 96 ng/mL (ref 10–154)

## 2015-07-21 LAB — LIPID PANEL
CHOL/HDL RATIO: 3.1 ratio (ref <=5.0)
CHOLESTEROL: 169 mg/dL (ref 125–200)
HDL: 55 mg/dL (ref >=46)
LDL CALC: 98 mg/dL (ref <130)
TRIGLYCERIDES: 81 mg/dL (ref <150)
VLDL: 16 mg/dL (ref <30)

## 2015-07-21 LAB — VITAMIN B12: VITAMIN B 12: 463 pg/mL (ref 200–1100)

## 2015-07-21 LAB — TSH: TSH: 1.39 mIU/L

## 2015-07-21 LAB — VITAMIN D 25 HYDROXY (VIT D DEFICIENCY, FRACTURES): VIT D 25 HYDROXY: 21 ng/mL — AB (ref 30–100)

## 2015-08-30 ENCOUNTER — Ambulatory Visit (INDEPENDENT_AMBULATORY_CARE_PROVIDER_SITE_OTHER): Payer: BLUE CROSS/BLUE SHIELD | Admitting: Family Medicine

## 2015-08-30 ENCOUNTER — Encounter: Payer: Self-pay | Admitting: Family Medicine

## 2015-08-30 ENCOUNTER — Ambulatory Visit (INDEPENDENT_AMBULATORY_CARE_PROVIDER_SITE_OTHER): Payer: BLUE CROSS/BLUE SHIELD

## 2015-08-30 ENCOUNTER — Telehealth: Payer: Self-pay

## 2015-08-30 VITALS — BP 120/70 | HR 95 | Wt 139.0 lb

## 2015-08-30 DIAGNOSIS — M545 Low back pain, unspecified: Secondary | ICD-10-CM

## 2015-08-30 MED ORDER — CYCLOBENZAPRINE HCL 10 MG PO TABS: 10.0000 mg | ORAL_TABLET | Freq: Three times a day (TID) | ORAL | Status: DC | PRN

## 2015-08-30 MED ORDER — KETOROLAC TROMETHAMINE 60 MG/2ML IM SOLN
60.0000 mg | Freq: Once | INTRAMUSCULAR | Status: AC
  Administered 2015-08-30: 60 mg via INTRAMUSCULAR

## 2015-08-30 NOTE — Addendum Note (Signed)
Addended by: Teddy Spike on: 08/30/2015 03:06 PM   Modules accepted: Orders

## 2015-08-30 NOTE — Patient Instructions (Signed)
Take ibuprofen 800 mg 3 times a day. Make sure to take with some food and water to avoid any stomach irritation or upset. Work on gentle stretches and handout. We will call with the x-ray results once available. Think about formal physical therapy and if you would like to move forward with that and please let us know.

## 2015-08-30 NOTE — Telephone Encounter (Signed)
Left message for patient that script was sent to pharmacy.

## 2015-08-30 NOTE — Progress Notes (Signed)
Subjective:    Patient ID: Debbie Rice, female    DOB: 1986/01/26, 30 y.o.   MRN: [id number]  HPI Back Pain pt stated that when she got out of her car this morning her back @ her tailbone began hurting. she proceeded into work and while there she turned and heard a pop. her pain is 6/10 constant and shooting. she took an IBU around 8am. she denies any trauma or injury. she does have a Hx of mid back pain.  Ibuprofen did provide some pain relief. Worse if she's been sitting for her. In contrast to stand up. Does not radiate down her leg, though can shoot into the buttock area at times..  Review of Systems   BP 120/70 mmHg  Pulse 95  Wt 139 lb (63.05 kg)  SpO2 98%    No Known Allergies  No past medical history on file.  No past surgical history on file.  Social History   Social History  . Marital Status: Married    Spouse Name: Darnelle Maffucci  . Number of Children: 0  . 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 Smoking status: Current Every Day Smoker -- 1.00 packs/day    Types: Cigarettes  . Smokeless tobacco: Not on file  . Alcohol Use: 0.0 - 0.5 oz/week    0-1 drink(s) per week  . Drug Use: Not on file  . Sexual Activity:    Partners: Male    Birth Control/ Protection: OCP   Other Topics Concern  . Not on file   Social History Narrative   No regular exercise.     Family History  Problem Relation Age of Onset  . Heart disease Father 64  . Heart disease Paternal Grandfather   . Heart disease Paternal Uncle   . Hypertension Paternal Grandfather   . Bipolar disorder Mother   . Uterine cancer Mother   . Alcohol abuse Father     Outpatient Encounter Prescriptions as of 08/30/2015  Medication Sig  . norethindrone-ethinyl estradiol (LOESTRIN FE 1/20) 1-20 MG-MCG tablet Take 1 tablet by mouth daily.  . [DISCONTINUED] clonazePAM (KLONOPIN) 1 MG tablet Take 1 tablet (1 mg total) by mouth 2 (two) times daily as needed.    No facility-administered encounter medications on file as of 08/30/2015.           Objective:   Physical Exam  Constitutional: She is oriented to person, place, and time. She appears well-developed and well-nourished.  HENT:  Head: Normocephalic and atraumatic.  Cardiovascular: Normal rate, regular rhythm and normal heart sounds.   Pulmonary/Chest: Effort normal and breath sounds normal.  Musculoskeletal:  Normal lumbar flexion. Decreased extension. Symmetric rotation right and left that she did have pain with rotation to the right in her left low back. Normal side bending. Negative straight leg raise bilaterally. Hip, knee, ankle shrink this out of 5 bilaterally. Nontender over the lumbar spine. Nontender over the SI joints. She points to pain at the area near the sacrum but to the left.  Neurological: She is alert and oriented to person, place, and time.  Skin: Skin is warm and dry.  Psychiatric: She has a normal mood and affect. Her behavior is normal.          Assessment & P lan:  Acute left low back pain near the sacrum-given Toradol for acute pain relief. Recommend anti-inflammatory, ibuprofen. Offered to refer for formal PT versus home PT.  Recommend heating pad as well. We'll go ahead and get up-to-date x-rays and call with results once available since she's also had some chronic underlying low back pain or the last several years.

## 2015-08-30 NOTE — Telephone Encounter (Signed)
Just sent rx for flexeril. Tell her sorry for the inconvenience

## 2016-01-14 ENCOUNTER — Other Ambulatory Visit: Payer: Self-pay | Admitting: Physician Assistant

## 2016-02-08 ENCOUNTER — Telehealth: Payer: Self-pay | Admitting: Physician Assistant

## 2016-02-08 NOTE — Telephone Encounter (Signed)
Patient called into the office questioning a bill she received for $660.00.  She has been sent to collections.   After researching, the bill is from [facility] for Spirit's pap smear in 2016.  The patient was billed for the total amount.  The insurance did not pay due to the diagnosis code.  I have emailed Peggye Form and Carolin Guernsey to change the diagnosis code to Z01.419 and Z11.3.  I have spoken with Margarito Courser at [phone], the bill will be reprocessed to [facility].  It will take 45 days to have a response.  Margarito Courser will take the patient out of collections.  Margarito Courser will notify me once they receive a payment or a denial.  I have called and left a message for Brittinee at [phone].

## 2016-05-18 ENCOUNTER — Ambulatory Visit (INDEPENDENT_AMBULATORY_CARE_PROVIDER_SITE_OTHER): Payer: BLUE CROSS/BLUE SHIELD | Admitting: Family Medicine

## 2016-05-18 VITALS — BP 124/70 | HR 77 | Temp 97.9°F | Wt 126.0 lb

## 2016-05-18 DIAGNOSIS — S39012A Strain of muscle, fascia and tendon of lower back, initial encounter: Secondary | ICD-10-CM | POA: Diagnosis not present

## 2016-05-18 MED ORDER — NAPROXEN 500 MG PO TABS
500.0000 mg | ORAL_TABLET | Freq: Two times a day (BID) | ORAL | 0 refills | Status: AC
Start: 2016-05-18 — End: ?

## 2016-05-18 MED ORDER — CYCLOBENZAPRINE HCL 10 MG PO TABS: 5.0000 mg | ORAL_TABLET | Freq: Three times a day (TID) | ORAL | 0 refills | Status: AC | PRN

## 2016-05-18 NOTE — Progress Notes (Signed)
Pt bent over in grocery store 2 weeks ago and has had back pain since.  It has been much worse the last couple of days.  Denies numbness and tingling.

## 2016-05-18 NOTE — Patient Instructions (Signed)
Thank you for coming in today. Do home exercises.  Use a tens unit and heating pad.  Take naproxen and flexeril as needed for pain.  If taking naproxen for more than a few days take it with zantic of pepcid. Let me know if you want to do PT.  Come back or go to the emergency room if you notice new weakness new numbness problems walking or bowel or bladder problems.  I think the muscles that are involved are the paraspinals, quadratus lumborum and multifidi     TENS UNIT: This is helpful for muscle pain and spasm.   Search and Purchase a TENS 7000 2nd edition at  www.tenspros.com or www.[email or web address] It should be less than $30.     TENS unit instructions: Do not shower or bathe with the unit on Turn the unit off before removing electrodes or batteries If the electrodes lose stickiness add a drop of water to the electrodes after they are disconnected from the unit and place on plastic sheet. If you continued to have difficulty, call the TENS unit company to purchase more electrodes. Do not apply lotion on the skin area prior to use. Make sure the skin is clean and dry as this will help prolong the life of the electrodes. After use, always check skin for unusual red areas, rash or other skin difficulties. If there are any skin problems, does not apply electrodes to the same area. Never remove the electrodes from the unit by pulling the wires. Do not use the TENS unit or electrodes other than as directed. Do not change electrode placement without consultating your therapist or physician. Keep 2 fingers with between each electrode. Wear time ratio is 2:1, on to off times.    For example on for 30 minutes off for 15 minutes and then on for 30 minutes off for 15 minutes

## 2016-05-18 NOTE — Progress Notes (Signed)
          Debbie Rice is a 31 y.o. female who presents to [facility] today for back pain.  She bent down two weeks ago and had sharp back pain in her left sacral area when she stood back up. Pain sometimes radiates down the back of her thighs, but usually is localized to the sacral area. Since then, she has taken ibuprofen and one hydrocodone with some relief. She feels that her legs are weak. No bowel or bladder incontinence. She had pain in the same location last year that improved with yoga, which she has not done in the past 3 months. She would like to avoid the expense of PT.   No past medical history on file. No past surgical history on file. Social History  Substance Use Topics  . Smoking status: Current Every Day Smoker    Packs/day: 1.00    Types: Cigarettes  . Smokeless tobacco: Not on file  . Alcohol use 0.0 - 0.5 oz/week    0 - 1 drink(s) per week     ROS:  As above   Medications: Current Outpatient Prescriptions  Medication Sig Dispense Refill  . cyclobenzaprine (FLEXERIL) 10 MG tablet Take 1 tablet (10 mg total) by mouth 3 (three) times daily as needed for muscle spasms. 30 tablet 0  . norethindrone-ethinyl estradiol (LOESTRIN FE 1/20) 1-20 MG-MCG tablet Take 1 tablet by mouth daily. 3 Package 4   No current facility-administered medications for this visit.    No Known Allergies   Exam:  BP 124/70 (BP Location: Right Arm, Patient Position: Sitting, Cuff Size: Normal)   Pulse 77   Temp 97.9 F (36.6 C) (Oral)   Wt 126 lb (57.2 kg)   SpO2 99%   BMI 22.32 kg/m  General: Well Developed, well nourished, and in no acute distress.  Neuro/Psych: Alert and oriented x3, extra-ocular muscles intact, able to move all 4 extremities, sensation grossly intact. Skin: Warm and dry, no rashes noted.  Respiratory: Not using accessory muscles, speaking in full sentences, trachea midline.  Cardiovascular: Pulses palpable, no extremity  edema. Abdomen: Does not appear distended. MSK: Lumbar spine nontender to palpation. Pain in left sacral area with extension and rotation. Full strength and sensation grossly intact in BLEs. Patellar and Achilles reflexes 2+ bilaterally. Slump test negative.   X-ray sacrum/coccyx 08/30/15: IMPRESSION: There is no acute or chronic bony abnormality of the sacrum or coccyx.  X-ray lumbar spine 01/30/16: FINDINGS: There is no evidence of lumbar spine fracture. Alignment is normal. Intervertebral disc spaces are maintained. IMPRESSION: Negative.  No results found for this or any previous visit (from the past 48 hour(s)). No results found.  Assessment and Plan: 31 y.o. female with acute left sacral pain, likely lumbosacral strain. Subjective weakness not observed on exam and is likely related to muscle spasm and guarding due to pain. Discussed benefits of formal PT; she would like to avoid it for now due to cost. - Home exercises - TENS unit, heating pad - Naproxen - Flexeril    No orders of the defined types were placed in this encounter.   Discussed warning signs or symptoms. Please see discharge instructions. Patient expresses understanding.

## 2016-05-22 IMAGING — US US SOFT TISSUE HEAD/NECK
1 series · 14 of 25 positions shown · non-contrast
Comparison: None.

CLINICAL DATA: Decreased energy and fatigue for the past 1 month.

EXAM:
THYROID ULTRASOUND
TECHNIQUE: Ultrasound examination of the thyroid gland and adjacent soft
tissues was performed.

[Series 1: us soft tissue head/neck · 0.05mm/px · 14 of 36 slices shown]
[im 1/36]
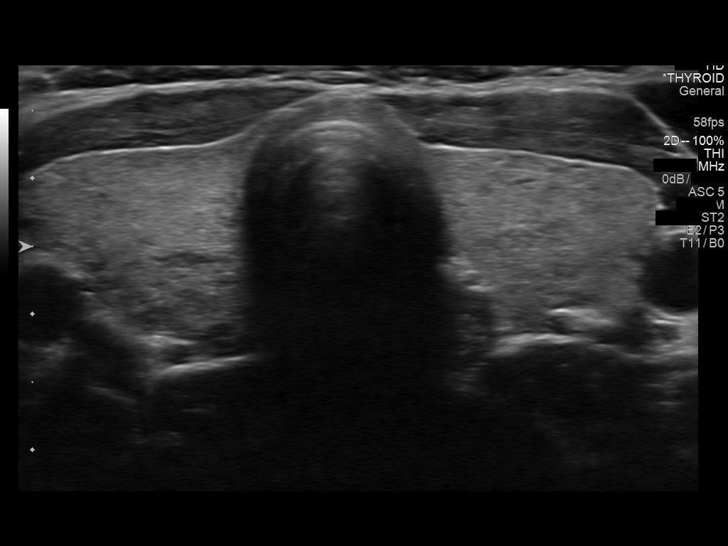
[im 3/36]
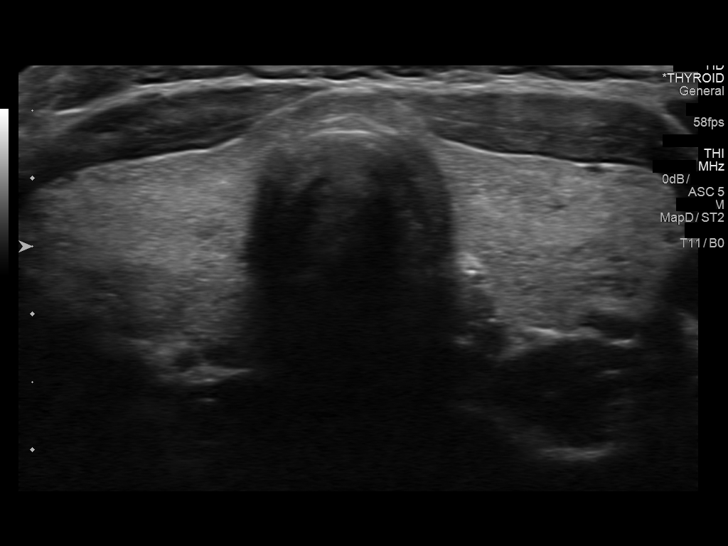
[im 6/36]
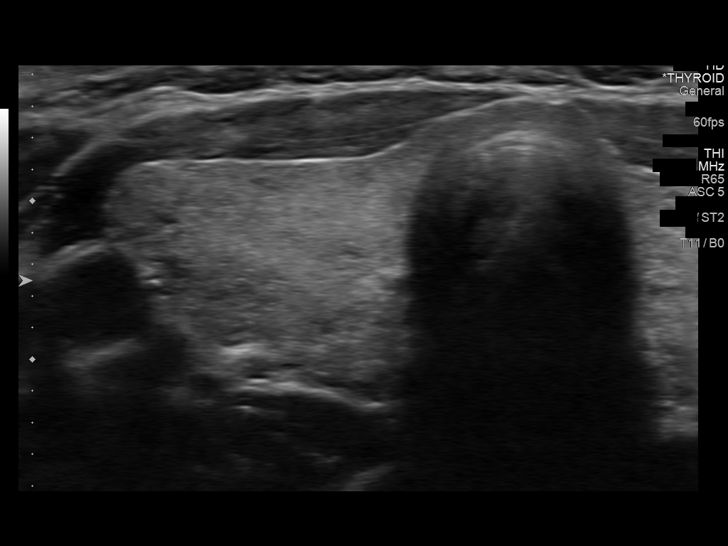
[im 9/36]
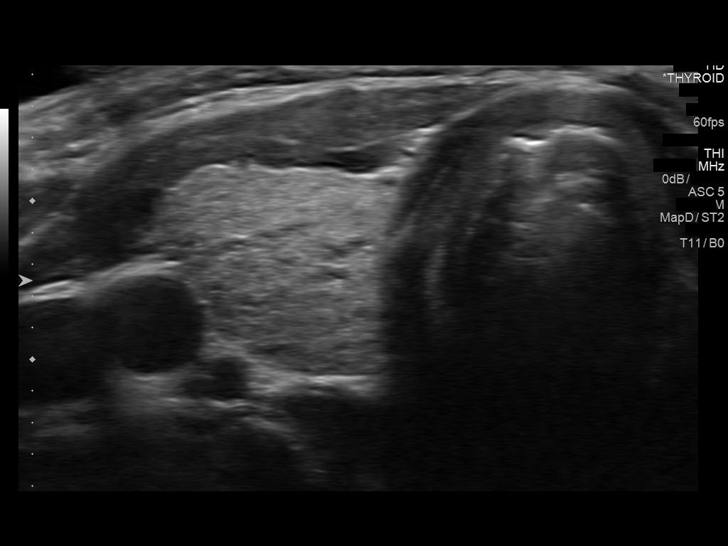
[im 12/36]
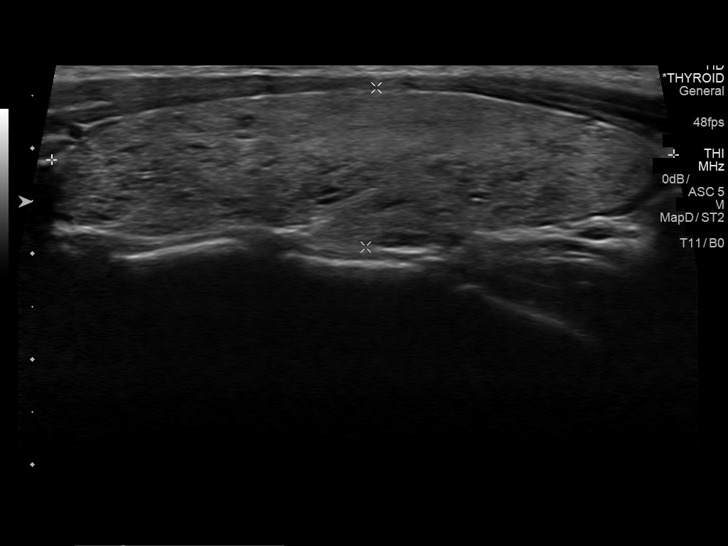
[im 14/36]
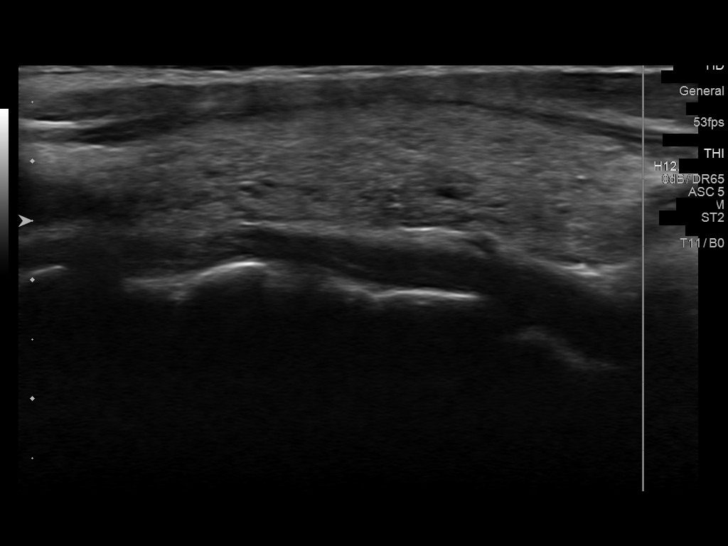
[im 17/36]
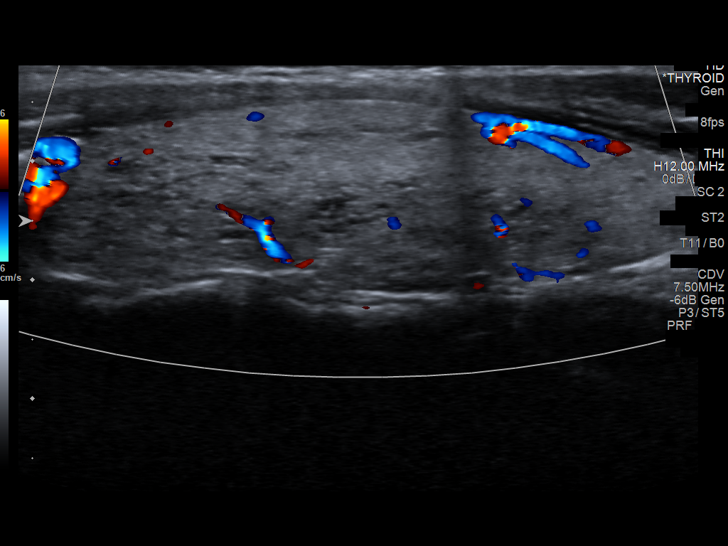
[im 19/36]
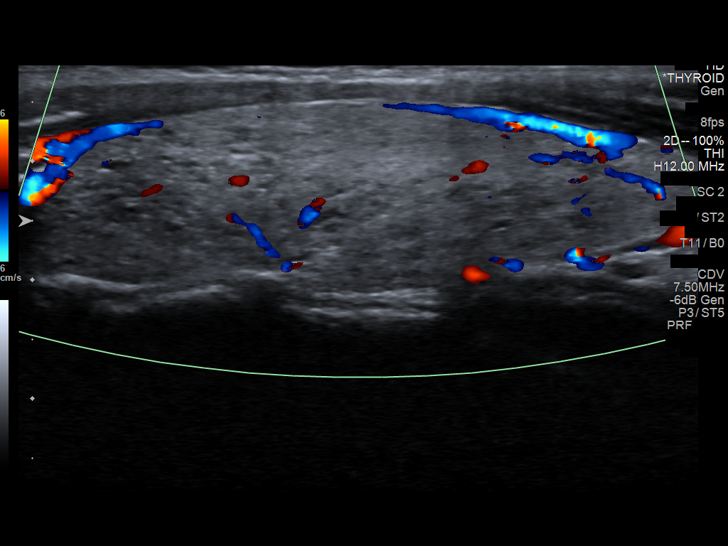
[im 22/36]
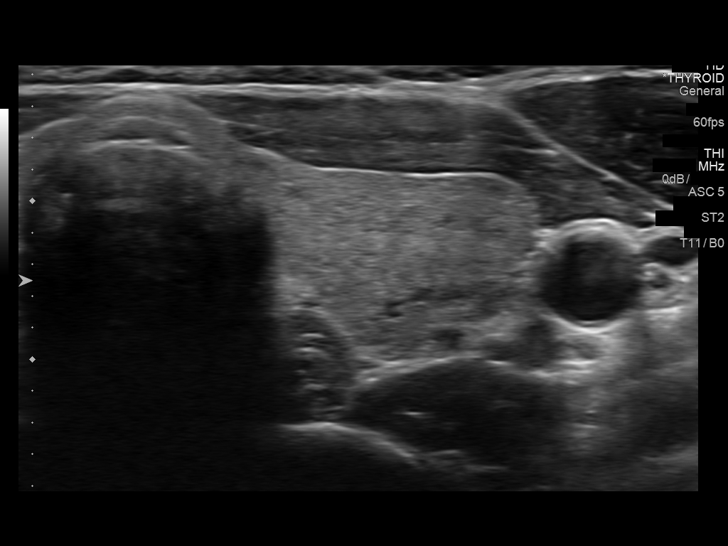
[im 24/36]
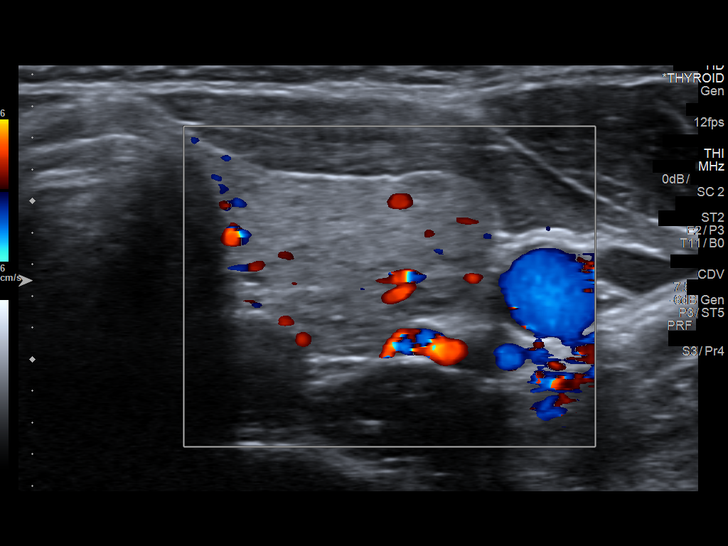
[im 27/36]
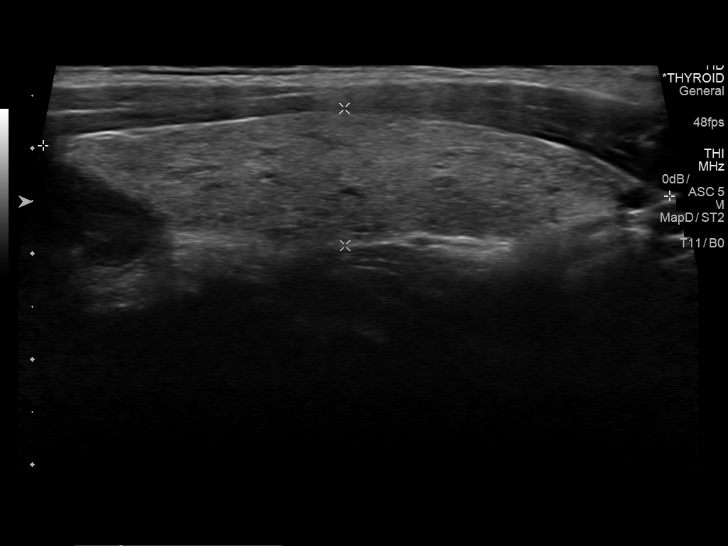
[im 30/36]
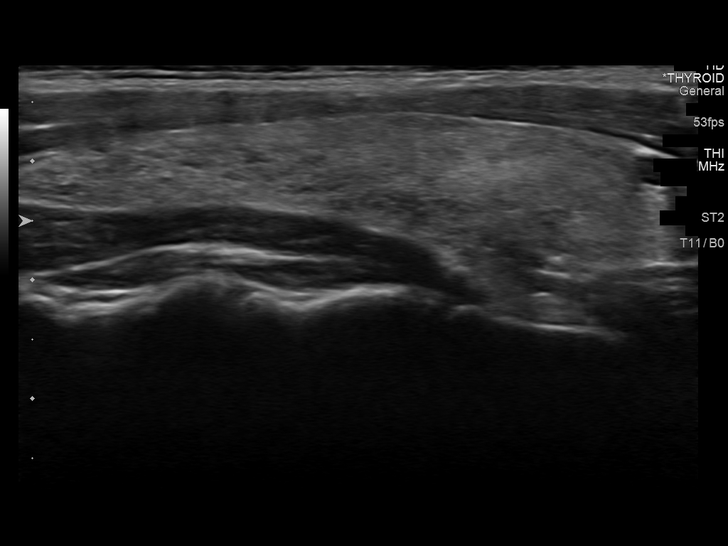
[im 33/36]
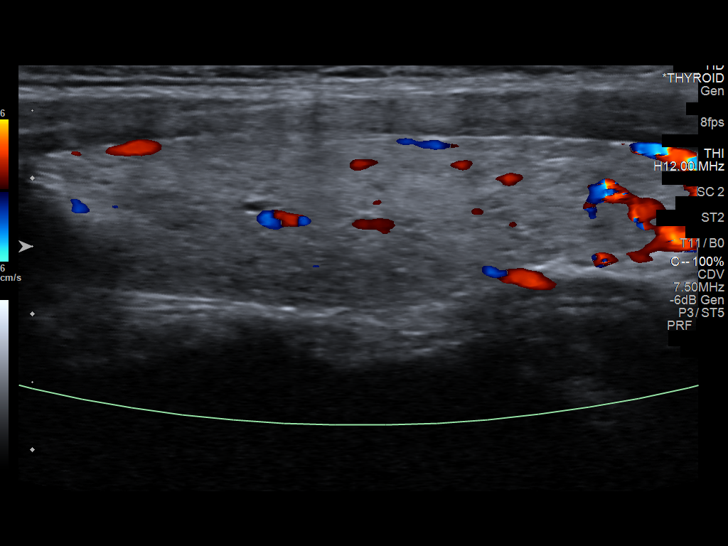
[im 36/36]
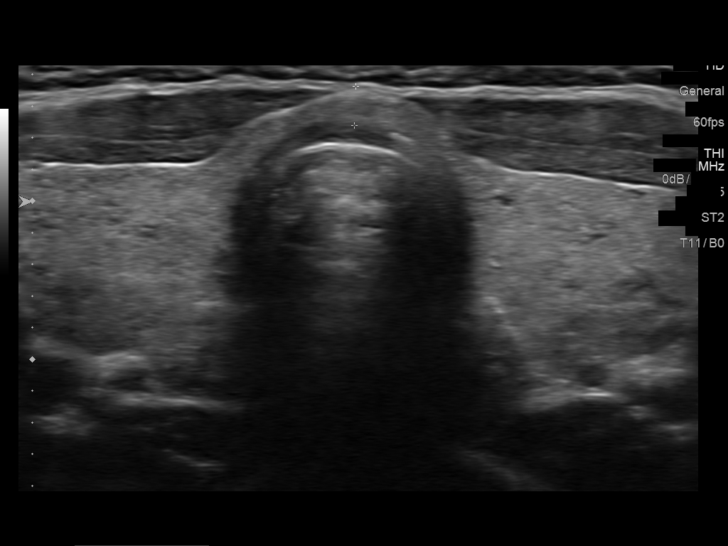

[14 of 25 positions shown; findings below may reference images not displayed]

FINDINGS: There is mild diffuse heterogeneity of the thyroid parenchymal
echotexture. Normal appearing glandular vascularity.

Right thyroid lobe

Measurements: Borderline enlarged measuring 5.9 x 1.5 x 1.9 cm.

There is mild diffuse heterogeneity the right lobe of the thyroid
without discrete nodule or mass.

Left thyroid lobe

Measurements: Borderline enlarged measuring 5.9 x 1.3 x 1.7 cm.

There is mild diffuse heterogeneity the left lobe of the thyroid
without discrete nodule or mass..

Isthmus

Thickness: Normal in size measuring 0.3 cm in diameter

No discrete nodule or mass identified when the thyroid isthmus.

Lymphadenopathy

None visualized.
IMPRESSION: Nonspecific borderline enlarged and heterogeneous appearing thyroid
gland without discrete nodule or mass. Correlation with thyroid
function tests is recommended.

## 2016-08-22 ENCOUNTER — Other Ambulatory Visit: Payer: Self-pay | Admitting: Physician Assistant

## 2016-08-24 ENCOUNTER — Other Ambulatory Visit: Payer: Self-pay | Admitting: *Deleted

## 2016-08-24 MED ORDER — NORETHIN ACE-ETH ESTRAD-FE 1-20 MG-MCG PO TABS: 1.0000 | ORAL_TABLET | Freq: Every day | ORAL | 4 refills | Status: AC

## 2017-08-11 IMAGING — DX DG SACRUM/COCCYX 2+V
3 series · 3 of 3 positions shown · non-contrast
Comparison: Sacrum and coccyx January 29, 2013

CLINICAL DATA: Left low back pain near the left buttock

EXAM:
SACRUM AND COCCYX - 2+ VIEW

[coccyx ap]
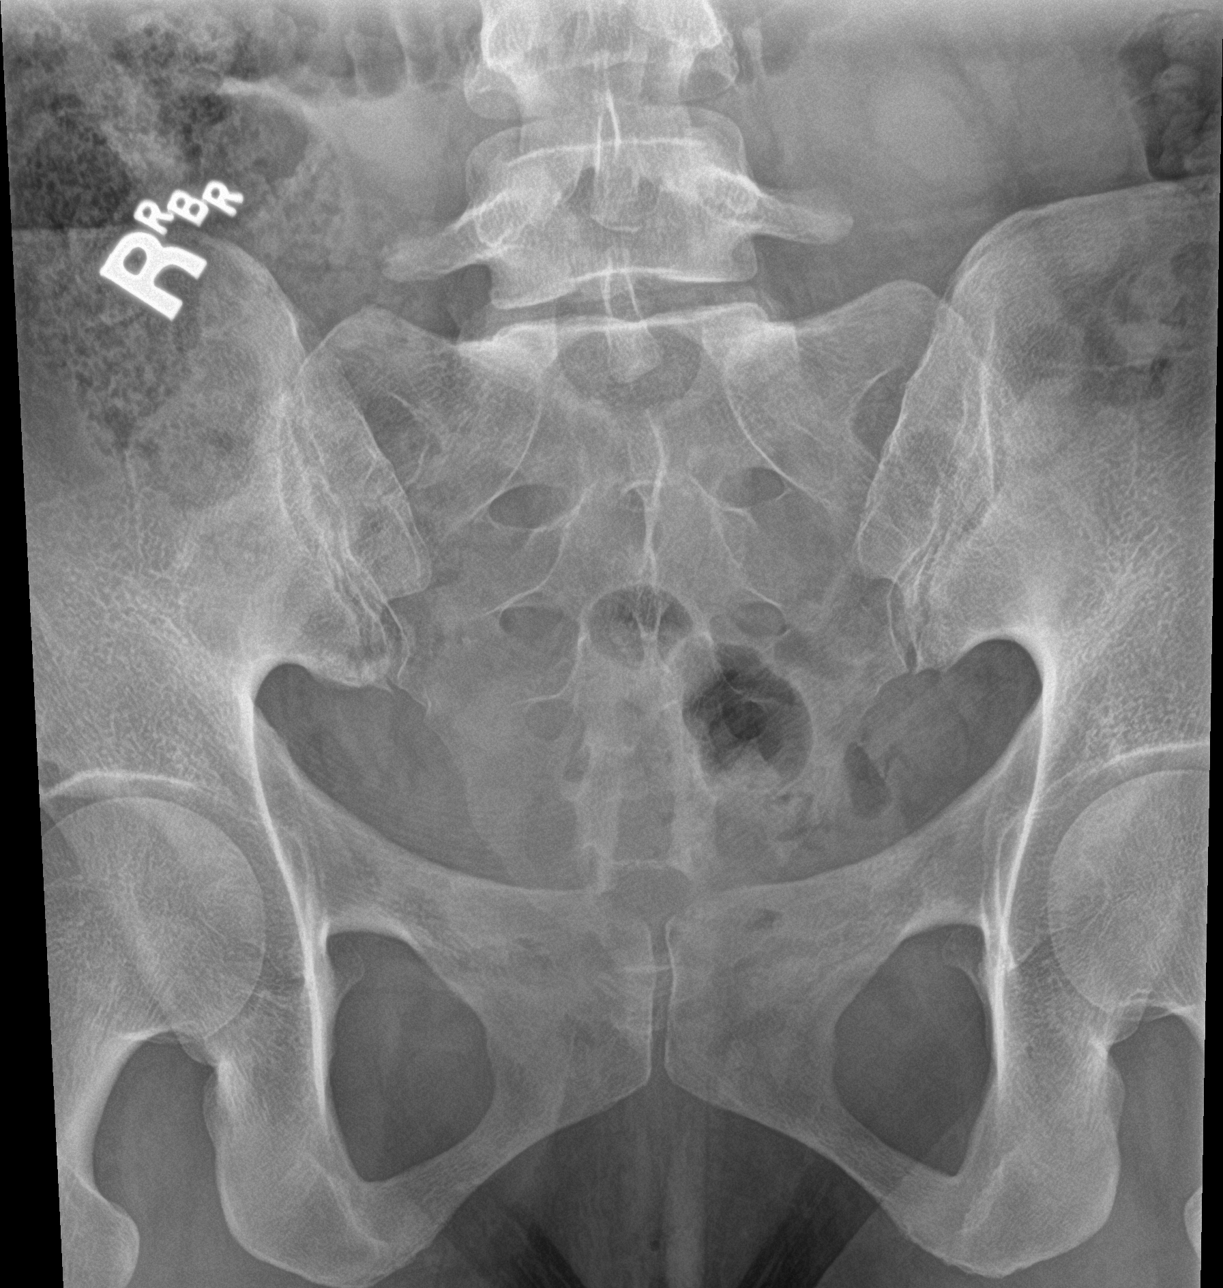

[sacrum ap]
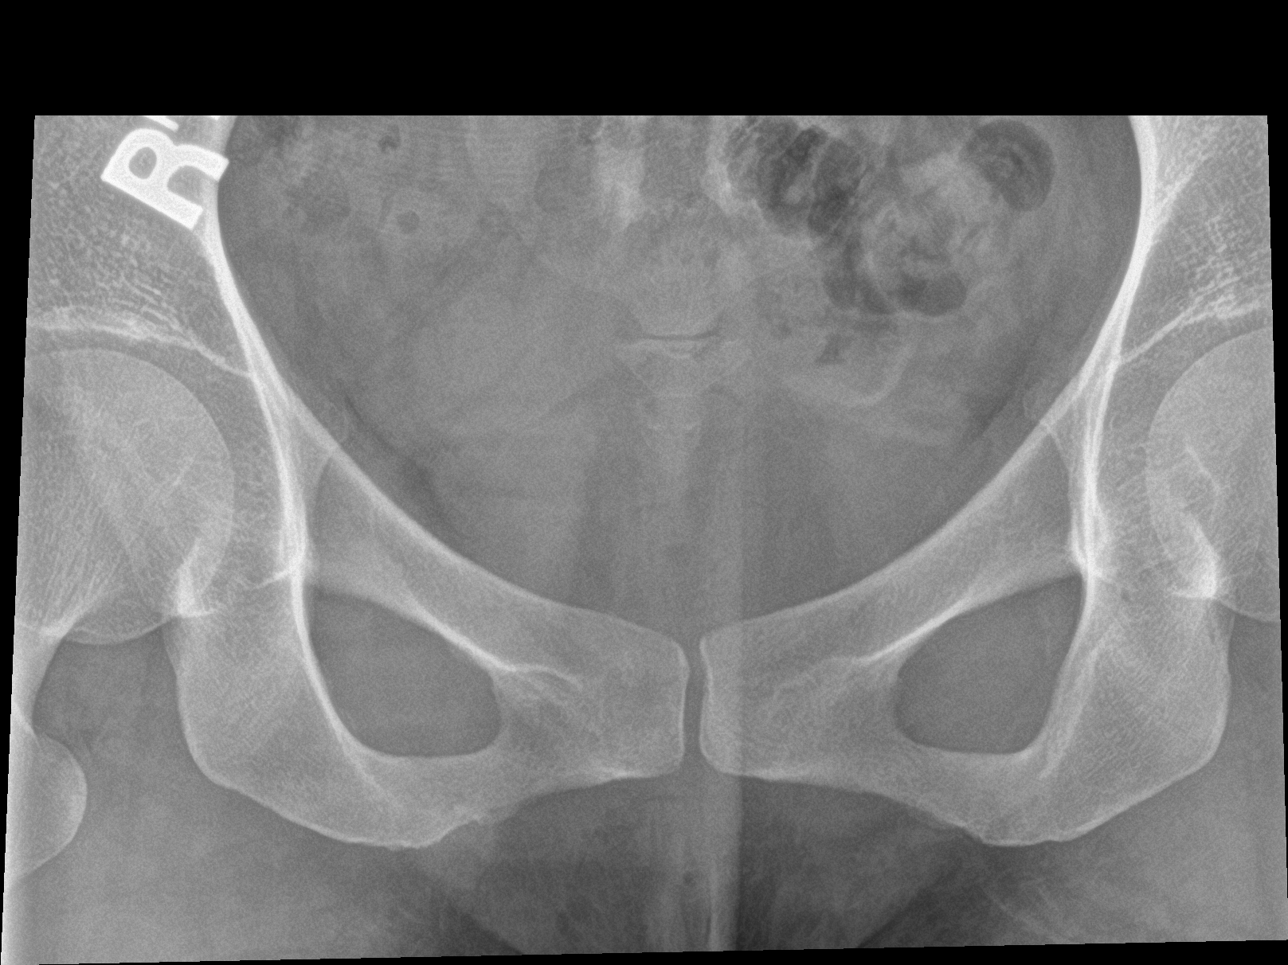

[sacrum lat]
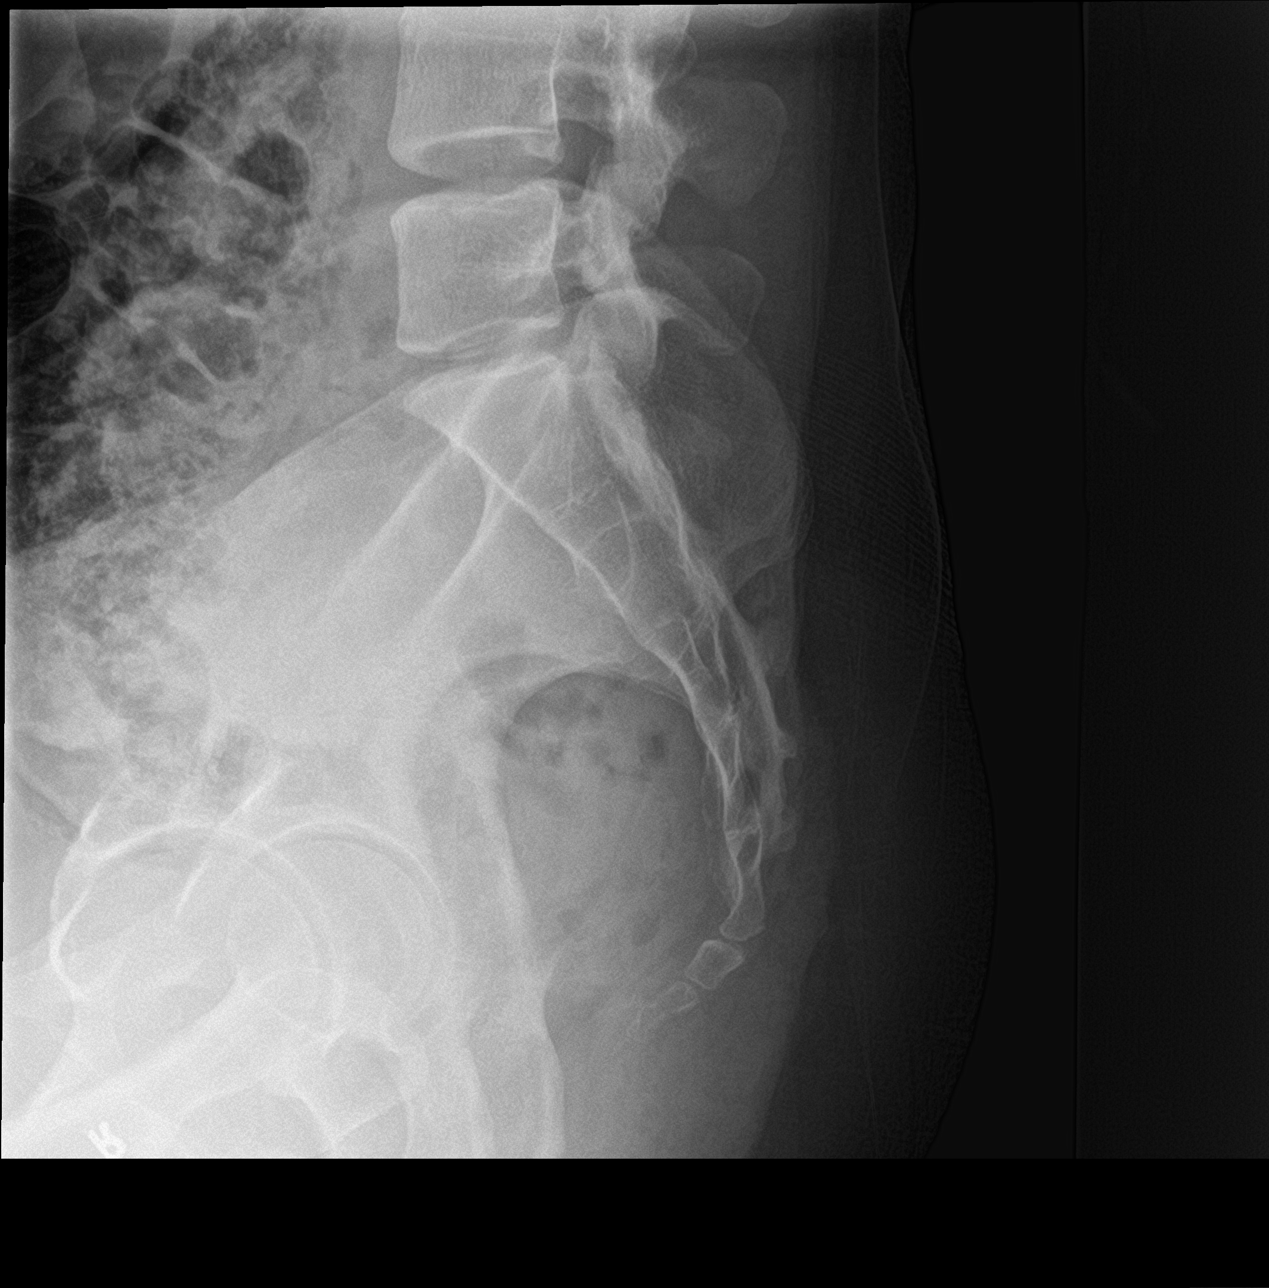

[3 of 3 positions shown; findings below may reference images not displayed]

FINDINGS: The sacrum is adequately mineralized. At least 3 intact sacral
struts are observed. The SI joints appear normal. The sacrococcygeal
junction is normal. The presacral soft tissues are normal..
IMPRESSION: There is no acute or chronic bony abnormality of the sacrum or
coccyx.

## 2017-10-08 ENCOUNTER — Other Ambulatory Visit: Payer: Self-pay | Admitting: Physician Assistant
